# Patient Record
Sex: Female | Born: 1955
Health system: Southern US, Community
[De-identification: ages and names within clinical notes are randomized; demographics above are authoritative.]

## PROBLEM LIST (undated history)

## (undated) DIAGNOSIS — IMO0002 Reserved for concepts with insufficient information to code with codable children: Secondary | ICD-10-CM

## (undated) DIAGNOSIS — L570 Actinic keratosis: Secondary | ICD-10-CM

## (undated) DIAGNOSIS — G709 Myoneural disorder, unspecified: Secondary | ICD-10-CM

## (undated) HISTORY — PX: BREAST EXCISIONAL BIOPSY: SUR124

## (undated) HISTORY — DX: Myoneural disorder, unspecified: G70.9

## (undated) HISTORY — DX: Actinic keratosis: L57.0

## (undated) HISTORY — DX: Reserved for concepts with insufficient information to code with codable children: IMO0002

---
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
## 2004-03-07 ENCOUNTER — Ambulatory Visit: Payer: Self-pay | Admitting: Unknown Physician Specialty

## 2005-01-29 LAB — HM COLONOSCOPY

## 2005-03-01 ENCOUNTER — Ambulatory Visit: Payer: Self-pay | Admitting: Unknown Physician Specialty

## 2005-05-16 ENCOUNTER — Ambulatory Visit: Payer: Self-pay | Admitting: Unknown Physician Specialty

## 2006-09-19 ENCOUNTER — Ambulatory Visit: Payer: Self-pay | Admitting: Unknown Physician Specialty

## 2007-04-24 DIAGNOSIS — IMO0002 Reserved for concepts with insufficient information to code with codable children: Secondary | ICD-10-CM

## 2007-04-24 HISTORY — DX: Reserved for concepts with insufficient information to code with codable children: IMO0002

## 2007-10-16 ENCOUNTER — Ambulatory Visit: Payer: Self-pay | Admitting: Unknown Physician Specialty

## 2007-10-21 ENCOUNTER — Ambulatory Visit: Payer: Self-pay | Admitting: Unknown Physician Specialty

## 2007-11-20 ENCOUNTER — Ambulatory Visit: Payer: Self-pay | Admitting: Specialist

## 2008-11-05 ENCOUNTER — Ambulatory Visit: Payer: Self-pay | Admitting: Unknown Physician Specialty

## 2009-11-23 ENCOUNTER — Ambulatory Visit: Payer: Self-pay | Admitting: Unknown Physician Specialty

## 2010-04-23 HISTORY — PX: BREAST BIOPSY: SHX20

## 2010-06-27 ENCOUNTER — Ambulatory Visit: Payer: Self-pay | Admitting: General Surgery

## 2010-11-28 ENCOUNTER — Ambulatory Visit: Payer: Self-pay | Admitting: General Surgery

## 2010-12-14 LAB — HM PAP SMEAR: HM Pap smear: NORMAL

## 2011-07-27 ENCOUNTER — Ambulatory Visit: Payer: Self-pay | Admitting: Internal Medicine

## 2011-09-13 ENCOUNTER — Ambulatory Visit (INDEPENDENT_AMBULATORY_CARE_PROVIDER_SITE_OTHER): Payer: BC Managed Care – PPO | Admitting: Internal Medicine

## 2011-09-13 ENCOUNTER — Encounter: Payer: Self-pay | Admitting: Internal Medicine

## 2011-09-13 VITALS — BP 98/60 | HR 66 | Temp 98.1°F | Resp 14 | Ht 64.5 in | Wt 109.5 lb

## 2011-09-13 DIAGNOSIS — IMO0002 Reserved for concepts with insufficient information to code with codable children: Secondary | ICD-10-CM

## 2011-09-13 MED ORDER — CYCLOBENZAPRINE HCL 10 MG PO TABS: 10.0000 mg | ORAL_TABLET | Freq: Three times a day (TID) | ORAL | Status: AC | PRN

## 2011-09-13 MED ORDER — MELOXICAM 15 MG PO TABS: 15.0000 mg | ORAL_TABLET | Freq: Every day | ORAL | Status: AC

## 2011-09-13 NOTE — Progress Notes (Addendum)
Patient ID: Katrina Fuentes, female   DOB: 08-04-1955, 56 y.o.   MRN: [id number]  There is no problem list on file for this patient.   Subjective:  CC:   Chief Complaint  Patient presents with  . New Patient    HPI:   Katrina Fuentes a 56 y.o. female who presents To establish primary care as a new patient.  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  She occasional episodes of insomnia. She has a chronic  Neck problem.  History of acute torticollis a few years ago ,  Nontraumatic.  Saw Reita Chard, evaluation revealed cervical disk diease by MRI.  Pain and spasm improved by mobic and PTbut  in  Dec 2012, but her left side started bothering her.  She wears a cervical collar at night but has not tried a cervical pillow.  She is going hiking through [place] June 20th.     History reviewed. No pertinent past medical history.  Past Surgical History  Procedure Date  . Breast biopsy 2012    by Evette Cristal         The following portions of the patient's history were reviewed and updated as appropriate: Allergies, current medications, and problem list.    Review of Systems:   12 Pt  review of systems was negative except those addressed in the HPI,     History   Social History  . Marital Status: Married    Spouse Name: N/A    Number of Children: N/A  . Years of Education: N/A   Occupational History  . Not on file.   Social History Main Topics  . Smoking status: Never Smoker   . Smokeless tobacco: Never Used  . Alcohol Use: 4.2 oz/week    7 Glasses of wine per week     wine  . Drug Use: No  . Sexually Active: Not on file   Other Topics Concern  . Not on file   Social History Narrative  . No narrative on file    Objective:  BP 98/60  Pulse 66  Temp(Src) 98.1 F (36.7 C) (Oral)  Resp 14  Ht 5' 4.5" (1.638 m)  Wt 109 lb 8 oz (49.669 kg)  BMI 18.51 kg/m2  SpO2 98%  General appearance: alert, cooperative and  appears stated age Ears: normal TM's and external ear canals both ears Throat: lips, mucosa, and tongue normal; teeth and gums normal Neck: no adenopathy, no carotid bruit, supple, symmetrical, trachea midline and thyroid not enlarged, symmetric, no tenderness/mass/nodules Back: symmetric, no curvature. ROM normal. No CVA tenderness. Lungs: clear to auscultation bilaterally Heart: regular rate and rhythm, S1, S2 normal, no murmur, click, rub or gallop Abdomen: soft, non-tender; bowel sounds normal; no masses,  no organomegaly Pulses: 2+ and symmetric Skin: Skin color, texture, turgor normal. No rashes or lesions Lymph nodes: Cervical, supraclavicular, and axillary nodes normal.   Degenerative disk disease She mild disk herniation at C4-C5 and C5-C6 on the left with persistent low grade pain involving the ear and side of head.  Given upcoming travel, risk of aggravation is increased with luggage carrying.     Updated Medication List Outpatient Encounter Prescriptions as of 09/13/2011  Medication Sig Dispense Refill  . cholecalciferol (VITAMIN D) 1000 UNITS tablet Take 1,000 Units by mouth daily.      . cyclobenzaprine (FLEXERIL) 10 MG tablet Take 1 tablet (10 mg total) by mouth 3 (three) times daily as needed for muscle spasms.  30 tablet  0  . meloxicam (MOBIC) 15 MG tablet Take 1 tablet (15 mg total) by mouth daily.  30 tablet  3  . Naproxen Sodium (ALEVE PO) Take by mouth as needed.       Assessment and Plan:

## 2011-09-17 ENCOUNTER — Encounter: Payer: Self-pay | Admitting: Internal Medicine

## 2011-09-17 DIAGNOSIS — IMO0002 Reserved for concepts with insufficient information to code with codable children: Secondary | ICD-10-CM | POA: Insufficient documentation

## 2011-09-17 NOTE — Assessment & Plan Note (Signed)
She mild disk herniation at C4-C5 and C5-C6 on the left with persistent low grade pain involving the ear and side of head.  Given upcoming travel, risk of aggravation is increased with luggage carrying.

## 2011-11-30 LAB — HM MAMMOGRAPHY: HM Mammogram: NORMAL

## 2011-12-04 ENCOUNTER — Ambulatory Visit: Payer: Self-pay | Admitting: General Surgery

## 2012-01-19 LAB — HM PAP SMEAR: HM Pap smear: ABNORMAL

## 2012-01-30 ENCOUNTER — Encounter: Payer: Self-pay | Admitting: Internal Medicine

## 2012-01-30 ENCOUNTER — Other Ambulatory Visit (HOSPITAL_COMMUNITY)
Admission: RE | Admit: 2012-01-30 | Discharge: 2012-01-30 | Disposition: A | Discharge status: Home / Self Care | Payer: BC Managed Care – PPO | Source: Ambulatory Visit | Attending: Internal Medicine | Admitting: Internal Medicine

## 2012-01-30 ENCOUNTER — Ambulatory Visit (INDEPENDENT_AMBULATORY_CARE_PROVIDER_SITE_OTHER): Payer: BC Managed Care – PPO | Admitting: Internal Medicine

## 2012-01-30 VITALS — BP 102/60 | HR 55 | Temp 98.2°F | Ht 64.5 in | Wt 107.8 lb

## 2012-01-30 DIAGNOSIS — N950 Postmenopausal bleeding: Secondary | ICD-10-CM

## 2012-01-30 DIAGNOSIS — Z1211 Encounter for screening for malignant neoplasm of colon: Secondary | ICD-10-CM

## 2012-01-30 DIAGNOSIS — N889 Noninflammatory disorder of cervix uteri, unspecified: Secondary | ICD-10-CM

## 2012-01-30 DIAGNOSIS — Z23 Encounter for immunization: Secondary | ICD-10-CM

## 2012-01-30 DIAGNOSIS — Z01419 Encounter for gynecological examination (general) (routine) without abnormal findings: Secondary | ICD-10-CM | POA: Insufficient documentation

## 2012-01-30 NOTE — Assessment & Plan Note (Signed)
PAP was done but cervix was abnormal appearing,  Friable and pale with a polypoid appearance.  Given her history of post menopausal bleeding, I have recommended gynecologic referral for endometria biopsy.

## 2012-01-30 NOTE — Progress Notes (Signed)
Patient ID: Katrina Fuentes, female   DOB: [birth year], 56 y.o.   MRN: [id number]  Subjective:     Katrina Fuentes is a 56 y.o. female and is here for a comprehensive physical exam. The patient reports an episode of postmenopausal bleeding several months ago. Amount of bleeding was light ,  Lasted about 24 hours ago.  Not sure if it occurred after intercourse. Last menstrual period  was 4 years ago.   History   Social History  . Marital Status: Married    Spouse Name: N/A    Number of Children: N/A  . Years of Education: N/A   Occupational History  . Not on file.   Social History Main Topics  . Smoking status: Never Smoker   . Smokeless tobacco: Never Used  . Alcohol Use: 4.2 oz/week    7 Glasses of wine per week     wine  . Drug Use: No  . Sexually Active: Not on file   Other Topics Concern  . Not on file   Social History Narrative  . No narrative on file   Health Maintenance  Topic Date Due  . Tetanus/tdap  07/25/1976  . Mammogram  07/26/2007  . Influenza Vaccine  12/23/2011  . Pap Smear  12/13/2013  . Colonoscopy  03/02/2015    The following portions of the patient's history were reviewed and updated as appropriate: allergies, current medications, past family history, past medical history, past social history, past surgical history and problem list.  Review of Systems A comprehensive review of systems was negative.   Objective:    BP 102/60  Pulse 55  Temp 98.2 F (36.8 C) (Oral)  Ht 5' 4.5" (1.638 m)  Wt 107 lb 12 oz (48.875 kg)  BMI 18.21 kg/m2  SpO2 99% General appearance: alert, cooperative and appears stated age Ears: normal TM's and external ear canals both ears Neck: no adenopathy, no carotid bruit, no JVD, supple, symmetrical, trachea midline and thyroid not enlarged, symmetric, no tenderness/mass/nodules Back: negative, symmetric, no curvature. ROM normal. No CVA tenderness. Lungs: clear to auscultation bilaterally Breasts: normal appearance, no masses or  tenderness Heart: regular rate and rhythm, S1, S2 normal, no murmur, click, rub or gallop Abdomen: soft, non-tender; bowel sounds normal; no masses,  no organomegaly Pelvic: adnexae not palpable, external genitalia normal, no adnexal masses or tenderness, no cervical motion tenderness, rectovaginal septum normal, uterus normal size, shape, and consistency and vagina normal without discharge Extremities: extremities normal, atraumatic, no cyanosis or edema Pulses: 2+ and symmetric Lymph nodes: Cervical, supraclavicular, and axillary nodes normal.    Assessment:     Abnormal appearance of cervix PAP was done but cervix was abnormal appearing,  Friable and pale with a polypoid appearance.  Given her history of post menopausal bleeding, I have recommended gynecologic referral for endometria biopsy.   Updated Medication List Outpatient Encounter Prescriptions as of 01/30/2012  Medication Sig Dispense Refill  . cholecalciferol (VITAMIN D) 1000 UNITS tablet Take 1,000 Units by mouth daily.      . meloxicam (MOBIC) 15 MG tablet Take 1 tablet (15 mg total) by mouth daily.  30 tablet  3  . Naproxen Sodium (ALEVE PO) Take by mouth as needed.

## 2012-06-16 ENCOUNTER — Ambulatory Visit: Payer: Self-pay | Admitting: Unknown Physician Specialty

## 2012-07-14 ENCOUNTER — Encounter: Payer: Self-pay | Admitting: Internal Medicine

## 2012-07-18 LAB — HM COLONOSCOPY

## 2013-01-22 ENCOUNTER — Ambulatory Visit: Payer: Self-pay | Admitting: Internal Medicine

## 2014-01-18 ENCOUNTER — Ambulatory Visit (INDEPENDENT_AMBULATORY_CARE_PROVIDER_SITE_OTHER): Payer: PRIVATE HEALTH INSURANCE | Admitting: Internal Medicine

## 2014-01-18 ENCOUNTER — Encounter: Payer: Self-pay | Admitting: Internal Medicine

## 2014-01-18 ENCOUNTER — Other Ambulatory Visit: Payer: Self-pay | Admitting: Internal Medicine

## 2014-01-18 VITALS — BP 104/70 | HR 51 | Temp 98.1°F | Resp 16 | Ht 64.5 in | Wt 106.5 lb

## 2014-01-18 DIAGNOSIS — Z0000 Encounter for general adult medical examination without abnormal findings: Secondary | ICD-10-CM

## 2014-01-18 DIAGNOSIS — E559 Vitamin D deficiency, unspecified: Secondary | ICD-10-CM

## 2014-01-18 DIAGNOSIS — M25441 Effusion, right hand: Secondary | ICD-10-CM

## 2014-01-18 DIAGNOSIS — R5381 Other malaise: Secondary | ICD-10-CM

## 2014-01-18 DIAGNOSIS — Z1239 Encounter for other screening for malignant neoplasm of breast: Secondary | ICD-10-CM

## 2014-01-18 DIAGNOSIS — R5383 Other fatigue: Secondary | ICD-10-CM

## 2014-01-18 DIAGNOSIS — N889 Noninflammatory disorder of cervix uteri, unspecified: Secondary | ICD-10-CM

## 2014-01-18 DIAGNOSIS — M25449 Effusion, unspecified hand: Secondary | ICD-10-CM

## 2014-01-18 DIAGNOSIS — Z1159 Encounter for screening for other viral diseases: Secondary | ICD-10-CM

## 2014-01-18 LAB — COMPREHENSIVE METABOLIC PANEL
ALT: 22 U/L (ref 0–35)
AST: 28 U/L (ref 0–37)
Albumin: 4.2 g/dL (ref 3.5–5.2)
Alkaline Phosphatase: 73 U/L (ref 39–117)
BILIRUBIN TOTAL: 0.7 mg/dL (ref 0.2–1.2)
BUN: 19 mg/dL (ref 6–23)
CO2: 31 mEq/L (ref 19–32)
CREATININE: 0.6 mg/dL (ref 0.4–1.2)
Calcium: 9.3 mg/dL (ref 8.4–10.5)
Chloride: 102 mEq/L (ref 96–112)
GFR: 101.85 mL/min (ref >60.00)
Glucose, Bld: 86 mg/dL (ref 70–99)
Potassium: 4.3 mEq/L (ref 3.5–5.1)
Sodium: 137 mEq/L (ref 135–145)
Total Protein: 6.3 g/dL (ref 6.0–8.3)

## 2014-01-18 LAB — CBC WITH DIFFERENTIAL/PLATELET
BASOS ABS: 0 10*3/uL (ref 0.0–0.1)
Basophils Relative: 0.7 % (ref 0.0–3.0)
Eosinophils Absolute: 0.2 10*3/uL (ref 0.0–0.7)
Eosinophils Relative: 3.7 % (ref 0.0–5.0)
HCT: 40.6 % (ref 36.0–46.0)
HEMOGLOBIN: 13.5 g/dL (ref 12.0–15.0)
Lymphocytes Relative: 31.4 % (ref 12.0–46.0)
Lymphs Abs: 1.5 10*3/uL (ref 0.7–4.0)
MCHC: 33.3 g/dL (ref 30.0–36.0)
MCV: 92.6 fl (ref 78.0–100.0)
Monocytes Absolute: 0.5 10*3/uL (ref 0.1–1.0)
Monocytes Relative: 9.8 % (ref 3.0–12.0)
NEUTROS ABS: 2.6 10*3/uL (ref 1.4–7.7)
Neutrophils Relative %: 54.4 % (ref 43.0–77.0)
Platelets: 178 10*3/uL (ref 150.0–400.0)
RBC: 4.38 Mil/uL (ref 3.87–5.11)
RDW: 14 % (ref 11.5–15.5)
WBC: 4.8 10*3/uL (ref 4.0–10.5)

## 2014-01-18 LAB — VITAMIN D 25 HYDROXY (VIT D DEFICIENCY, FRACTURES): VITD: 52.76 ng/mL (ref 30.00–100.00)

## 2014-01-18 LAB — TSH: TSH: 2.59 u[IU]/mL (ref 0.35–4.50)

## 2014-01-18 LAB — C-REACTIVE PROTEIN: CRP: <0.5 mg/dL (ref 0.5–20.0)

## 2014-01-18 LAB — SEDIMENTATION RATE: SED RATE: 9 mm/h (ref 0–22)

## 2014-01-18 MED ORDER — MELOXICAM 15 MG PO TABS: 15.0000 mg | ORAL_TABLET | Freq: Every day | ORAL | Status: DC

## 2014-01-18 MED ORDER — DICLOFENAC SODIUM 1 % TD GEL
2.0000 g | Freq: Four times a day (QID) | TRANSDERMAL | Status: DC
Start: 2014-01-18 — End: 2016-06-06

## 2014-01-18 NOTE — Assessment & Plan Note (Signed)
Knuckle is slightly warm and twice normal size.  ERS, CRP and x ray ordered to rule out erosive arthritis.

## 2014-01-18 NOTE — Assessment & Plan Note (Signed)
Endometrial and cervical biopsy were done in 2013 after normal PAP and were normal.  Changes were attributed to scarring .  She has no history fo abnormal PAP smears/

## 2014-01-18 NOTE — Assessment & Plan Note (Signed)
Annual comprehensive exam was done including breast, excluding pelvic and PAP smear. All screenings have been addressed and a printed health maintenance schedule was given to patient.   

## 2014-01-18 NOTE — Progress Notes (Signed)
Patient ID: Katrina Fuentes, female   DOB: 09/20/1955, 58 y.o.   MRN: [id number]  Subjective:     Katrina Fuentes is a 58 y.o. female and is here for a comprehensive physical exam. The patient reports problems - with persistent swelling and deformity of middle finger on right hand PIP.  History   Social History  . Marital Status: Married    Spouse Name: N/A    Number of Children: N/A  . Years of Education: N/A   Occupational History  . Not on file.   Social History Main Topics  . Smoking status: Never Smoker   . Smokeless tobacco: Never Used  . Alcohol Use: 4.2 oz/week    7 Glasses of wine per week     Comment: wine  . Drug Use: No  . Sexual Activity: Not on file   Other Topics Concern  . Not on file   Social History Narrative  . No narrative on file   Health Maintenance  Topic Date Due  . Tetanus/tdap  07/25/1976  . Influenza Vaccine  11/21/2013  . Mammogram  11/29/2013  . Pap Smear  02/24/2015  . Colonoscopy  06/16/2022    The following portions of the patient's history were reviewed and updated as appropriate: allergies, current medications, past family history, past medical history, past social history, past surgical history and problem list.  Review of Systems A comprehensive review of systems was negative.   Objective:    BP 104/70  Pulse 51  Temp(Src) 98.1 F (36.7 C) (Oral)  Resp 16  Ht 5' 4.5" (1.638 m)  Wt 106 lb 8 oz (48.308 kg)  BMI 18.00 kg/m2  SpO2 99%  General appearance: alert, cooperative and appears stated age Ears: normal TM's and external ear canals both ears Throat: lips, mucosa, and tongue normal; teeth and gums normal Neck: no adenopathy, no carotid bruit, supple, symmetrical, trachea midline and thyroid not enlarged, symmetric, no tenderness/mass/nodules Back: symmetric, no curvature. ROM normal. No CVA tenderness. Lungs: clear to auscultation bilaterally Heart: regular rate and rhythm, S1, S2 normal, no murmur, click, rub or  gallop Abdomen: soft, non-tender; bowel sounds normal; no masses,  no organomegaly Pulses: 2+ and symmetric Skin: Skin color, texture, turgor normal. No rashes or lesions Lymph nodes: Cervical, supraclavicular, and axillary nodes normal.   Assessment and plan:   Abnormal appearance of cervix Endometrial and cervical biopsy were done in 2013 after normal PAP and were normal.  Changes were attributed to scarring .  She has no history fo abnormal PAP smears/  Swelling of finger joint of right hand Knuckle is slightly warm and twice normal size.  ERS, CRP and x ray ordered to rule out erosive arthritis.   Visit for preventive health examination Annual comprehensive exam was done including breast, excluding pelvic and PAP smear. All screenings have been addressed and a printed health maintenance schedule was given to patient.     Updated Medication List Outpatient Encounter Prescriptions as of 01/18/2014  Medication Sig  . cholecalciferol (VITAMIN D) 1000 UNITS tablet Take 1,000 Units by mouth daily.  . diclofenac sodium (VOLTAREN) 1 % GEL Apply 2 g topically 4 (four) times daily.  . diclofenac sodium (VOLTAREN) 1 % GEL Apply 2 g topically 4 (four) times daily.  . meloxicam (MOBIC) 15 MG tablet Take 1 tablet (15 mg total) by mouth daily.  . Naproxen Sodium (ALEVE PO) Take by mouth as needed.  . [DISCONTINUED] meloxicam (MOBIC) 15 MG tablet Take 15 mg by  mouth daily.

## 2014-01-18 NOTE — Progress Notes (Signed)
Pre-visit discussion using our clinic review tool. No additional management support is needed unless otherwise documented below in the visit note.  

## 2014-01-18 NOTE — Patient Instructions (Signed)
You had your annual  wellness exam today.  We will repeat your PAP smear in 2016, sooner if needed   We will schedule your mammogram at [facility] received the TDaP vaccine today.  If you develop a sore arm , use tylenol and ibuprofen for a few days   We will contact you with the bloodwork results  Health Maintenance Adopting a healthy lifestyle and getting preventive care can go a long way to promote health and wellness. Talk with your health care provider about what schedule of regular examinations is right for you. This is a good chance for you to check in with your provider about disease prevention and staying healthy. In between checkups, there are plenty of things you can do on your own. Experts have done a lot of research about which lifestyle changes and preventive measures are most likely to keep you healthy. Ask your health care provider for more information. WEIGHT AND DIET  Eat a healthy diet  Be sure to include plenty of vegetables, fruits, low-fat dairy products, and lean protein.  Do not eat a lot of foods high in solid fats, added sugars, or salt.  Get regular exercise. This is one of the most important things you can do for your health.  Most adults should exercise for at least 150 minutes each week. The exercise should increase your heart rate and make you sweat (moderate-intensity exercise).  Most adults should also do strengthening exercises at least twice a week. This is in addition to the moderate-intensity exercise.  Maintain a healthy weight  Body mass index (BMI) is a measurement that can be used to identify possible weight problems. It estimates body fat based on height and weight. Your health care provider can help determine your BMI and help you achieve or maintain a healthy weight.  For females 73 years of age and older:   A BMI below 18.5 is considered underweight.  A BMI of 18.5 to 24.9 is normal.  A BMI of 25 to 29.9 is considered overweight.  A  BMI of 30 and above is considered obese.  Watch levels of cholesterol and blood lipids  You should start having your blood tested for lipids and cholesterol at 56 years of age, then have this test every 5 years.  You may need to have your cholesterol levels checked more often if:  Your lipid or cholesterol levels are high.  You are older than 56 years of age.  You are at high risk for heart disease.  CANCER SCREENING   Lung Cancer  Lung cancer screening is recommended for adults 50-59 years old who are at high risk for lung cancer because of a history of smoking.  A yearly low-dose CT scan of the lungs is recommended for people who:  Currently smoke.  Have quit within the past 15 years.  Have at least a 30-pack-year history of smoking. A pack year is smoking an average of one pack of cigarettes a day for 1 year.  Yearly screening should continue until it has been 15 years since you quit.  Yearly screening should stop if you develop a health problem that would prevent you from having lung cancer treatment.  Breast Cancer  Practice breast self-awareness. This means understanding how your breasts normally appear and feel.  It also means doing regular breast self-exams. Let your health care provider know about any changes, no matter how small.  If you are in your 20s or 30s, you should have a  clinical breast exam (CBE) by a health care provider every 1-3 years as part of a regular health exam.  If you are 41 or older, have a CBE every year. Also consider having a breast X-ray (mammogram) every year.  If you have a family history of breast cancer, talk to your health care provider about genetic screening.  If you are at high risk for breast cancer, talk to your health care provider about having an MRI and a mammogram every year.  Breast cancer gene (BRCA) assessment is recommended for women who have family members with BRCA-related cancers. BRCA-related cancers  include:  Breast.  Ovarian.  Tubal.  Peritoneal cancers.  Results of the assessment will determine the need for genetic counseling and BRCA1 and BRCA2 testing. Cervical Cancer Routine pelvic examinations to screen for cervical cancer are no longer recommended for nonpregnant women who are considered low risk for cancer of the pelvic organs (ovaries, uterus, and vagina) and who do not have symptoms. A pelvic examination may be necessary if you have symptoms including those associated with pelvic infections. Ask your health care provider if a screening pelvic exam is right for you.   The Pap test is the screening test for cervical cancer for women who are considered at risk.  If you had a hysterectomy for a problem that was not cancer or a condition that could lead to cancer, then you no longer need Pap tests.  If you are older than 65 years, and you have had normal Pap tests for the past 10 years, you no longer need to have Pap tests.  If you have had past treatment for cervical cancer or a condition that could lead to cancer, you need Pap tests and screening for cancer for at least 20 years after your treatment.  If you no longer get a Pap test, assess your risk factors if they change (such as having a new sexual partner). This can affect whether you should start being screened again.  Some women have medical problems that increase their chance of getting cervical cancer. If this is the case for you, your health care provider may recommend more frequent screening and Pap tests.  The human papillomavirus (HPV) test is another test that may be used for cervical cancer screening. The HPV test looks for the virus that can cause cell changes in the cervix. The cells collected during the Pap test can be tested for HPV.  The HPV test can be used to screen women 60 years of age and older. Getting tested for HPV can extend the interval between normal Pap tests from three to five years.  An HPV  test also should be used to screen women of any age who have unclear Pap test results.  After 56 years of age, women should have HPV testing as often as Pap tests.  Colorectal Cancer  This type of cancer can be detected and often prevented.  Routine colorectal cancer screening usually begins at 56 years of age and continues through 56 years of age.  Your health care provider may recommend screening at an earlier age if you have risk factors for colon cancer.  Your health care provider may also recommend using home test kits to check for hidden blood in the stool.  A small camera at the end of a tube can be used to examine your colon directly (sigmoidoscopy or colonoscopy). This is done to check for the earliest forms of colorectal cancer.  Routine screening usually begins at  age 33.  Direct examination of the colon should be repeated every 5-10 years through 56 years of age. However, you may need to be screened more often if early forms of precancerous polyps or small growths are found. Skin Cancer  Check your skin from head to toe regularly.  Tell your health care provider about any new moles or changes in moles, especially if there is a change in a mole's shape or color.  Also tell your health care provider if you have a mole that is larger than the size of a pencil eraser.  Always use sunscreen. Apply sunscreen liberally and repeatedly throughout the day.  Protect yourself by wearing long sleeves, pants, a wide-brimmed hat, and sunglasses whenever you are outside. HEART DISEASE, DIABETES, AND HIGH BLOOD PRESSURE   Have your blood pressure checked at least every 1-2 years. High blood pressure causes heart disease and increases the risk of stroke.  If you are between 36 years and 7 years old, ask your health care provider if you should take aspirin to prevent strokes.  Have regular diabetes screenings. This involves taking a blood sample to check your fasting blood sugar  level.  If you are at a normal weight and have a low risk for diabetes, have this test once every three years after 56 years of age.  If you are overweight and have a high risk for diabetes, consider being tested at a younger age or more often. PREVENTING INFECTION  Hepatitis B  If you have a higher risk for hepatitis B, you should be screened for this virus. You are considered at high risk for hepatitis B if:  You were born in a country where hepatitis B is common. Ask your health care provider which countries are considered high risk.  Your parents were born in a high-risk country, and you have not been immunized against hepatitis B (hepatitis B vaccine).  You have HIV or AIDS.  You use needles to inject street drugs.  You live with someone who has hepatitis B.  You have had sex with someone who has hepatitis B.  You get hemodialysis treatment.  You take certain medicines for conditions, including cancer, organ transplantation, and autoimmune conditions. Hepatitis C  Blood testing is recommended for:  Everyone born from [birth year] through 1963.  Anyone with known risk factors for hepatitis C. Sexually transmitted infections (STIs)  You should be screened for sexually transmitted infections (STIs) including gonorrhea and chlamydia if:  You are sexually active and are younger than 56 years of age.  You are older than 56 years of age and your health care provider tells you that you are at risk for this type of infection.  Your sexual activity has changed since you were last screened and you are at an increased risk for chlamydia or gonorrhea. Ask your health care provider if you are at risk.  If you do not have HIV, but are at risk, it may be recommended that you take a prescription medicine daily to prevent HIV infection. This is called pre-exposure prophylaxis (PrEP). You are considered at risk if:  You are sexually active and do not regularly use condoms or know the HIV status  of your partner(s).  You take drugs by injection.  You are sexually active with a partner who has HIV. Talk with your health care provider about whether you are at high risk of being infected with HIV. If you choose to begin PrEP, you should first be tested for HIV. You  should then be tested every 3 months for as long as you are taking PrEP.  PREGNANCY   If you are premenopausal and you may become pregnant, ask your health care provider about preconception counseling.  If you may become pregnant, take 400 to 800 micrograms (mcg) of folic acid every day.  If you want to prevent pregnancy, talk to your health care provider about birth control (contraception). OSTEOPOROSIS AND MENOPAUSE   Osteoporosis is a disease in which the bones lose minerals and strength with aging. This can result in serious bone fractures. Your risk for osteoporosis can be identified using a bone density scan.  If you are 50 years of age or older, or if you are at risk for osteoporosis and fractures, ask your health care provider if you should be screened.  Ask your health care provider whether you should take a calcium or vitamin D supplement to lower your risk for osteoporosis.  Menopause may have certain physical symptoms and risks.  Hormone replacement therapy may reduce some of these symptoms and risks. Talk to your health care provider about whether hormone replacement therapy is right for you.  HOME CARE INSTRUCTIONS   Schedule regular health, dental, and eye exams.  Stay current with your immunizations.   Do not use any tobacco products including cigarettes, chewing tobacco, or electronic cigarettes.  If you are pregnant, do not drink alcohol.  If you are breastfeeding, limit how much and how often you drink alcohol.  Limit alcohol intake to no more than 1 drink per day for nonpregnant women. One drink equals 12 ounces of beer, 5 ounces of wine, or 1 ounces of hard liquor.  Do not use street  drugs.  Do not share needles.  Ask your health care provider for help if you need support or information about quitting drugs.  Tell your health care provider if you often feel depressed.  Tell your health care provider if you have ever been abused or do not feel safe at home. Document Released: 10/23/2010 Document Revised: 08/24/2013 Document Reviewed: 03/11/2013 [facility] Patient Information 2015 [place], [place]. This information is not intended to replace advice given to you by your health care provider. Make sure you discuss any questions you have with your health care provider.

## 2014-01-19 LAB — HEPATITIS C ANTIBODY: HCV Ab: NEGATIVE

## 2014-01-20 ENCOUNTER — Encounter: Payer: Self-pay | Admitting: *Deleted

## 2014-02-03 ENCOUNTER — Ambulatory Visit: Payer: Self-pay | Admitting: Internal Medicine

## 2014-02-05 ENCOUNTER — Encounter: Payer: Self-pay | Admitting: *Deleted

## 2014-02-16 ENCOUNTER — Encounter: Payer: Self-pay | Admitting: Internal Medicine

## 2015-05-02 ENCOUNTER — Other Ambulatory Visit: Payer: Self-pay | Admitting: Internal Medicine

## 2015-06-03 ENCOUNTER — Other Ambulatory Visit (HOSPITAL_COMMUNITY)
Admission: RE | Admit: 2015-06-03 | Discharge: 2015-06-03 | Disposition: A | Discharge status: Home / Self Care | Payer: Managed Care, Other (non HMO) | Source: Ambulatory Visit | Attending: Internal Medicine | Admitting: Internal Medicine

## 2015-06-03 ENCOUNTER — Ambulatory Visit (INDEPENDENT_AMBULATORY_CARE_PROVIDER_SITE_OTHER): Payer: Managed Care, Other (non HMO) | Admitting: Internal Medicine

## 2015-06-03 ENCOUNTER — Encounter: Payer: Self-pay | Admitting: Internal Medicine

## 2015-06-03 VITALS — BP 100/78 | HR 63 | Temp 98.1°F | Resp 12 | Ht 65.0 in | Wt 105.8 lb

## 2015-06-03 DIAGNOSIS — R5383 Other fatigue: Secondary | ICD-10-CM | POA: Diagnosis not present

## 2015-06-03 DIAGNOSIS — M720 Palmar fascial fibromatosis [Dupuytren]: Secondary | ICD-10-CM

## 2015-06-03 DIAGNOSIS — Z1151 Encounter for screening for human papillomavirus (HPV): Secondary | ICD-10-CM | POA: Insufficient documentation

## 2015-06-03 DIAGNOSIS — Z124 Encounter for screening for malignant neoplasm of cervix: Secondary | ICD-10-CM

## 2015-06-03 DIAGNOSIS — E559 Vitamin D deficiency, unspecified: Secondary | ICD-10-CM

## 2015-06-03 DIAGNOSIS — E785 Hyperlipidemia, unspecified: Secondary | ICD-10-CM

## 2015-06-03 DIAGNOSIS — Z01419 Encounter for gynecological examination (general) (routine) without abnormal findings: Secondary | ICD-10-CM | POA: Insufficient documentation

## 2015-06-03 DIAGNOSIS — Z0000 Encounter for general adult medical examination without abnormal findings: Secondary | ICD-10-CM

## 2015-06-03 DIAGNOSIS — N952 Postmenopausal atrophic vaginitis: Secondary | ICD-10-CM

## 2015-06-03 DIAGNOSIS — M8580 Other specified disorders of bone density and structure, unspecified site: Secondary | ICD-10-CM

## 2015-06-03 DIAGNOSIS — Z1239 Encounter for other screening for malignant neoplasm of breast: Secondary | ICD-10-CM

## 2015-06-03 LAB — LIPID PANEL
CHOL/HDL RATIO: 2
Cholesterol: 256 mg/dL — ABNORMAL HIGH (ref 0–200)
HDL: 109.6 mg/dL (ref >39.00)
LDL Cholesterol: 135 mg/dL — ABNORMAL HIGH (ref 0–99)
NONHDL: 145.95
Triglycerides: 53 mg/dL (ref 0.0–149.0)
VLDL: 10.6 mg/dL (ref 0.0–40.0)

## 2015-06-03 LAB — TSH: TSH: 2.87 u[IU]/mL (ref 0.35–4.50)

## 2015-06-03 LAB — COMPREHENSIVE METABOLIC PANEL
ALBUMIN: 4.5 g/dL (ref 3.5–5.2)
ALK PHOS: 83 U/L (ref 39–117)
ALT: 16 U/L (ref 0–35)
AST: 24 U/L (ref 0–37)
BUN: 18 mg/dL (ref 6–23)
CHLORIDE: 104 meq/L (ref 96–112)
CO2: 32 mEq/L (ref 19–32)
Calcium: 10 mg/dL (ref 8.4–10.5)
Creatinine, Ser: 0.72 mg/dL (ref 0.40–1.20)
GFR: 88.47 mL/min (ref >60.00)
Glucose, Bld: 91 mg/dL (ref 70–99)
POTASSIUM: 4.3 meq/L (ref 3.5–5.1)
Sodium: 141 mEq/L (ref 135–145)
Total Bilirubin: 0.4 mg/dL (ref 0.2–1.2)
Total Protein: 7.1 g/dL (ref 6.0–8.3)

## 2015-06-03 LAB — VITAMIN D 25 HYDROXY (VIT D DEFICIENCY, FRACTURES): VITD: 45.84 ng/mL (ref 30.00–100.00)

## 2015-06-03 LAB — LDL CHOLESTEROL, DIRECT: Direct LDL: 116 mg/dL

## 2015-06-03 MED ORDER — ESTROGENS, CONJUGATED 0.625 MG/GM VA CREA: 1.0000 | TOPICAL_CREAM | Freq: Every day | VAGINAL | Status: DC

## 2015-06-03 NOTE — Progress Notes (Signed)
Pre-visit discussion using our clinic review tool. No additional management support is needed unless otherwise documented below in the visit note.  

## 2015-06-03 NOTE — Patient Instructions (Addendum)
We discussed starting premarin ( or estrace, whichever is covered by your insurance)   Use it nightly for 2 weeks,,  Then reduce to twice weekly   Menopause is a normal process in which your reproductive ability comes to an end. This process happens gradually over a span of months to years, usually between the ages of 43 and 50. Menopause is complete when you have missed 12 consecutive menstrual periods. It is important to talk with your health care provider about some of the most common conditions that affect postmenopausal women, such as heart disease, cancer, and bone loss (osteoporosis). Adopting a healthy lifestyle and getting preventive care can help to promote your health and wellness. Those actions can also lower your chances of developing some of these common conditions. WHAT SHOULD I KNOW ABOUT MENOPAUSE? During menopause, you may experience a number of symptoms, such as:  Moderate-to-severe hot flashes.  Night sweats.  Decrease in sex drive.  Mood swings.  Headaches.  Tiredness.  Irritability.  Memory problems.  Insomnia. Choosing to treat or not to treat menopausal changes is an individual decision that you make with your health care provider. WHAT SHOULD I KNOW ABOUT HORMONE REPLACEMENT THERAPY AND SUPPLEMENTS? Hormone therapy products are effective for treating symptoms that are associated with menopause, such as hot flashes and night sweats. Hormone replacement carries certain risks, especially as you become older. If you are thinking about using estrogen or estrogen with progestin treatments, discuss the benefits and risks with your health care provider. WHAT SHOULD I KNOW ABOUT HEART DISEASE AND STROKE? Heart disease, heart attack, and stroke become more likely as you age. This may be due, in part, to the hormonal changes that your body experiences during menopause. These can affect how your body processes dietary fats, triglycerides, and cholesterol. Heart attack and  stroke are both medical emergencies. There are many things that you can do to help prevent heart disease and stroke:  Have your blood pressure checked at least every 1-2 years. High blood pressure causes heart disease and increases the risk of stroke.  If you are 48-61 years old, ask your health care provider if you should take aspirin to prevent a heart attack or a stroke.  Do not use any tobacco products, including cigarettes, chewing tobacco, or electronic cigarettes. If you need help quitting, ask your health care provider.  It is important to eat a healthy diet and maintain a healthy weight.  Be sure to include plenty of vegetables, fruits, low-fat dairy products, and lean protein.  Avoid eating foods that are high in solid fats, added sugars, or salt (sodium).  Get regular exercise. This is one of the most important things that you can do for your health.  Try to exercise for at least 150 minutes each week. The type of exercise that you do should increase your heart rate and make you sweat. This is known as moderate-intensity exercise.  Try to do strengthening exercises at least twice each week. Do these in addition to the moderate-intensity exercise.  Know your numbers.Ask your health care provider to check your cholesterol and your blood glucose. Continue to have your blood tested as directed by your health care provider. WHAT SHOULD I KNOW ABOUT CANCER SCREENING? There are several types of cancer. Take the following steps to reduce your risk and to catch any cancer development as early as possible. Breast Cancer  Practice breast self-awareness.  This means understanding how your breasts normally appear and feel.  It also means  doing regular breast self-exams. Let your health care provider know about any changes, no matter how small.  If you are 77 or older, have a clinician do a breast exam (clinical breast exam or CBE) every year. Depending on your age, family history, and  medical history, it may be recommended that you also have a yearly breast X-ray (mammogram).  If you have a family history of breast cancer, talk with your health care provider about genetic screening.  If you are at high risk for breast cancer, talk with your health care provider about having an MRI and a mammogram every year.  Breast cancer (BRCA) gene test is recommended for women who have family members with BRCA-related cancers. Results of the assessment will determine the need for genetic counseling and BRCA1 and for BRCA2 testing. BRCA-related cancers include these types:  Breast. This occurs in males or females.  Ovarian.  Tubal. This may also be called fallopian tube cancer.  Cancer of the abdominal or pelvic lining (peritoneal cancer).  Prostate.  Pancreatic. Cervical, Uterine, and Ovarian Cancer Your health care provider may recommend that you be screened regularly for cancer of the pelvic organs. These include your ovaries, uterus, and vagina. This screening involves a pelvic exam, which includes checking for microscopic changes to the surface of your cervix (Pap test).  For women ages 21-65, health care providers may recommend a pelvic exam and a Pap test every three years. For women ages 30-65, they may recommend the Pap test and pelvic exam, combined with testing for human papilloma virus (HPV), every five years. Some types of HPV increase your risk of cervical cancer. Testing for HPV may also be done on women of any age who have unclear Pap test results.  Other health care providers may not recommend any screening for nonpregnant women who are considered low risk for pelvic cancer and have no symptoms. Ask your health care provider if a screening pelvic exam is right for you.  If you have had past treatment for cervical cancer or a condition that could lead to cancer, you need Pap tests and screening for cancer for at least 20 years after your treatment. If Pap tests have  been discontinued for you, your risk factors (such as having a new sexual partner) need to be reassessed to determine if you should start having screenings again. Some women have medical problems that increase the chance of getting cervical cancer. In these cases, your health care provider may recommend that you have screening and Pap tests more often.  If you have a family history of uterine cancer or ovarian cancer, talk with your health care provider about genetic screening.  If you have vaginal bleeding after reaching menopause, tell your health care provider.  There are currently no reliable tests available to screen for ovarian cancer. Lung Cancer Lung cancer screening is recommended for adults 44-25 years old who are at high risk for lung cancer because of a history of smoking. A yearly low-dose CT scan of the lungs is recommended if you:  Currently smoke.  Have a history of at least 30 pack-years of smoking and you currently smoke or have quit within the past 15 years. A pack-year is smoking an average of one pack of cigarettes per day for one year. Yearly screening should:  Continue until it has been 15 years since you quit.  Stop if you develop a health problem that would prevent you from having lung cancer treatment. Colorectal Cancer  This type  of cancer can be detected and can often be prevented.  Routine colorectal cancer screening usually begins at age 49 and continues through age 49.  If you have risk factors for colon cancer, your health care provider may recommend that you be screened at an earlier age.  If you have a family history of colorectal cancer, talk with your health care provider about genetic screening.  Your health care provider may also recommend using home test kits to check for hidden blood in your stool.  A small camera at the end of a tube can be used to examine your colon directly (sigmoidoscopy or colonoscopy). This is done to check for the earliest  forms of colorectal cancer.  Direct examination of the colon should be repeated every 5-10 years until age 65. However, if early forms of precancerous polyps or small growths are found or if you have a family history or genetic risk for colorectal cancer, you may need to be screened more often. Skin Cancer  Check your skin from head to toe regularly.  Monitor any moles. Be sure to tell your health care provider:  About any new moles or changes in moles, especially if there is a change in a mole's shape or color.  If you have a mole that is larger than the size of a pencil eraser.  If any of your family members has a history of skin cancer, especially at a young age, talk with your health care provider about genetic screening.  Always use sunscreen. Apply sunscreen liberally and repeatedly throughout the day.  Whenever you are outside, protect yourself by wearing long sleeves, pants, a wide-brimmed hat, and sunglasses. WHAT SHOULD I KNOW ABOUT OSTEOPOROSIS? Osteoporosis is a condition in which bone destruction happens more quickly than new bone creation. After menopause, you may be at an increased risk for osteoporosis. To help prevent osteoporosis or the bone fractures that can happen because of osteoporosis, the following is recommended:  If you are 75-76 years old, get at least 1,000 mg of calcium and at least 600 mg of vitamin D per day.  If you are older than age 66 but younger than age 43, get at least 1,200 mg of calcium and at least 600 mg of vitamin D per day.  If you are older than age 51, get at least 1,200 mg of calcium and at least 800 mg of vitamin D per day. Smoking and excessive alcohol intake increase the risk of osteoporosis. Eat foods that are rich in calcium and vitamin D, and do weight-bearing exercises several times each week as directed by your health care provider. WHAT SHOULD I KNOW ABOUT HOW MENOPAUSE AFFECTS [place]? Depression may occur at any age, but  it is more common as you become older. Common symptoms of depression include:  Low or sad mood.  Changes in sleep patterns.  Changes in appetite or eating patterns.  Feeling an overall lack of motivation or enjoyment of activities that you previously enjoyed.  Frequent crying spells. Talk with your health care provider if you think that you are experiencing depression. WHAT SHOULD I KNOW ABOUT IMMUNIZATIONS? It is important that you get and maintain your immunizations. These include:  Tetanus, diphtheria, and pertussis (Tdap) booster vaccine.  Influenza every year before the flu season begins.  Pneumonia vaccine.  Shingles vaccine. Your health care provider may also recommend other immunizations.   This information is not intended to replace advice given to you by your health care provider. Make  sure you discuss any questions you have with your health care provider.   Document Released: 06/01/2005 Document Revised: 04/30/2014 Document Reviewed: 12/10/2013 Elsevier Interactive Patient Education [employer].

## 2015-06-03 NOTE — Progress Notes (Signed)
Patient ID: Katrina Fuentes, female    DOB: Oct 07, 1955  Age: 60 y.o. MRN: [id number]  The patient is here for annual  wellness examination and management of other chronic and acute problems.   normal PAP 2013 Normal colonoscopy 2014,  rec repeat 2019 (FH polyps only)  Hep  C screen 2015 Last Mammogram 2015   Taking 1000 IUS Vit D  Daily    The risk factors are reflected in the social history.  The roster of all physicians providing medical care to patient - is listed in the Snapshot section of the chart.  Activities of daily living:  The patient is 100% independent in all ADLs: dressing, toileting, feeding as well as independent mobility  Home safety : The patient has smoke detectors in the home. They wear seatbelts.  There are no firearms at home. There is no violence in the home.   There is no risks for hepatitis, STDs or HIV. There is no   history of blood transfusion. They have no travel history to infectious disease endemic areas of the world.  The patient has seen their dentist in the last six month. They have seen their eye doctor in the last year. They admit to slight hearing difficulty with regard to whispered voices and some television programs.  They have deferred audiologic testing in the last year.  They do not  have excessive sun exposure. Discussed the need for sun protection: hats, long sleeves and use of sunscreen if there is significant sun exposure.   Diet: the importance of a healthy diet is discussed. They do have a healthy diet.  The benefits of regular aerobic exercise were discussed. She walks 4 times per week ,  20 minutes.   Depression screen: there are no signs or vegative symptoms of depression- irritability, change in appetite, anhedonia, sadness/tearfullness.  Cognitive assessment: the patient manages all their financial and personal affairs and is actively engaged. They could relate day,date,year and events; recalled 2/3 objects at 3 minutes; performed  clock-face test normally.  The following portions of the patient's history were reviewed and updated as appropriate: allergies, current medications, past family history, past medical history,  past surgical history, past social history  and problem list.  Visual acuity was not assessed per patient preference since she has regular follow up with her ophthalmologist. Hearing and body mass index were assessed and reviewed.   During the course of the visit the patient was educated and counseled about appropriate screening and preventive services including : fall prevention , diabetes screening, nutrition counseling, colorectal cancer screening, and recommended immunizations.    CC: The primary encounter diagnosis was Dupuytren's contracture of right hand. Diagnoses of Other fatigue, Hyperlipidemia, Vitamin D deficiency, Osteopenia, Breast cancer screening, Postmenopause atrophic vaginitis, Cervical cancer screening, Visit for preventive health examination, and Postmenopausal atrophic vaginitis were also pertinent to this visit.  History Katrina has a past medical history of Neuromuscular disorder ([place]) and Degenerative disk disease (2009).   She has past surgical history that includes Breast biopsy (2012).   Her family history includes Cancer (age of onset: 69) in her father; Hyperlipidemia in her father; Stroke in her father.She reports that she has never smoked. She has never used smokeless tobacco. She reports that she drinks about 4.2 oz of alcohol per week. She reports that she does not use illicit drugs.  Outpatient Prescriptions Prior to Visit  Medication Sig Dispense Refill  . cholecalciferol (VITAMIN D) 1000 UNITS tablet Take 1,000 Units by mouth daily.    Marland Kitchen  diclofenac sodium (VOLTAREN) 1 % GEL Apply 2 g topically 4 (four) times daily.    . diclofenac sodium (VOLTAREN) 1 % GEL Apply 2 g topically 4 (four) times daily. 100 g 2  . meloxicam (MOBIC) 15 MG tablet Take 1 tablet (15 mg total) by  mouth daily. 30 tablet 2  . Naproxen Sodium (ALEVE PO) Take by mouth as needed.     No facility-administered medications prior to visit.    Review of Systems   Patient denies headache, fevers, malaise, unintentional weight loss, skin rash, eye pain, sinus congestion and sinus pain, sore throat, dysphagia,  hemoptysis , cough, dyspnea, wheezing, chest pain, palpitations, orthopnea, edema, abdominal pain, nausea, melena, diarrhea, constipation, flank pain, dysuria, hematuria, urinary  Frequency, nocturia, numbness, tingling, seizures,  Focal weakness, Loss of consciousness,  Tremor, insomnia, depression, anxiety, and suicidal ideation.    Objective:  BP 100/78 mmHg  Pulse 63  Temp(Src) 98.1 F (36.7 C) (Oral)  Resp 12  Ht 5\' 5"  (1.651 m)  Wt 105 lb 12 oz (47.968 kg)  BMI 17.60 kg/m2  SpO2 98%   Physical Exam  General Appearance:    Alert, cooperative, no distress, appears stated age  Head:    Normocephalic, without obvious abnormality, atraumatic  Eyes:    PERRL, conjunctiva/corneas clear, EOM's intact, fundi    benign, both eyes  Ears:    Normal TM's and external ear canals, both ears  Nose:   Nares normal, septum midline, mucosa normal, no drainage    or sinus tenderness  Throat:   Lips, mucosa, and tongue normal; teeth and gums normal  Neck:   Supple, symmetrical, trachea midline, no adenopathy;    thyroid:  no enlargement/tenderness/nodules; no carotid   bruit or JVD  Back:     Symmetric, no curvature, ROM normal, no CVA tenderness  Lungs:     Clear to auscultation bilaterally, respirations unlabored  Chest Wall:    No tenderness or deformity   Heart:    Regular rate and rhythm, S1 and S2 normal, no murmur, rub   or gallop  Breast Exam:    No tenderness, masses, or nipple abnormality  Abdomen:     Soft, non-tender, bowel sounds active all four quadrants,    no masses, no organomegaly  Genitalia:    Pelvic: cervix normal in appearance, external genitalia normal, no adnexal  masses or tenderness, no cervical motion tenderness, rectovaginal septum normal, uterus normal size, shape, and consistency and vagina normal without discharge  Extremities:   Extremities normal, atraumatic, no cyanosis or edema  Pulses:   2+ and symmetric all extremities  Skin:   Skin color, texture, turgor normal, no rashes or lesions  Lymph nodes:   Cervical, supraclavicular, and axillary nodes normal  Neurologic:   CNII-XII intact, normal strength, sensation and reflexes    throughout     Assessment & Plan:   Problem List Items Addressed This Visit    Visit for preventive health examination    Annual comprehensive preventive exam was done as well as an evaluation and management of chronic conditions .  During the course of the visit the patient was educated and counseled about appropriate screening and preventive services including :  diabetes screening, lipid analysis with projected  10 year  risk for CAD , nutrition counseling, breast, cervical and colorectal cancer screening, and recommended immunizations.  Printed recommendations for health maintenance screenings was given.  Lab Results  Component Value Date   TSH 2.87 06/03/2015   Lab  Results  Component Value Date   CHOL 256* 06/03/2015   HDL 109.60 06/03/2015   LDLCALC 135* 06/03/2015   LDLDIRECT 116.0 06/03/2015   TRIG 53.0 06/03/2015   CHOLHDL 2 06/03/2015         Postmenopausal atrophic vaginitis    Trial of Premarin vaginal cream for dyspareunia secondary to vaginal dryness that did not improved with OTC lubricants.       Other Visit Diagnoses    Dupuytren's contracture of right hand    -  Primary    Relevant Orders    Ambulatory referral to Orthopedic Surgery    Other fatigue        Relevant Orders    Comprehensive metabolic panel (Completed)    TSH (Completed)    CBC with Differential/Platelet (Completed)    Hyperlipidemia        Relevant Orders    Lipid panel (Completed)    LDL cholesterol, direct  (Completed)    Vitamin D deficiency        Relevant Orders    VITAMIN D 25 Hydroxy (Vit-D Deficiency, Fractures) (Completed)    Osteopenia        Relevant Orders    DG Bone Density    Breast cancer screening        Relevant Orders    MM DIGITAL SCREENING BILATERAL    Postmenopause atrophic vaginitis        Relevant Medications    conjugated estrogens (PREMARIN) vaginal cream    Other Relevant Orders    Cytology - PAP    Cervical cancer screening        Relevant Orders    Cytology - PAP       I am having Ms. Fuentes start on conjugated estrogens. I am also having her maintain her cholecalciferol, Naproxen Sodium (ALEVE PO), diclofenac sodium, diclofenac sodium, meloxicam, clobetasol, and PICATO.  Meds ordered this encounter  Medications  . clobetasol (TEMOVATE) 0.05 % external solution    Sig: USE 1-2 APPLICATIONS APPLY ON THE SKIN DAILY. APPLY TO AFFECTED AREA OF SCALP AS NEEDED    Refill:  11  . PICATO 0.05 % GEL    Sig: APPLY ON THE SKIN AS DIRECTED FOR 2 NIGHTS    Refill:  1  . conjugated estrogens (PREMARIN) vaginal cream    Sig: Place 1 Applicatorful vaginally daily.    Dispense:  42.5 g    Refill:  12    There are no discontinued medications.  Follow-up: No Follow-up on file.   Crecencio Mc, MD

## 2015-06-05 DIAGNOSIS — N952 Postmenopausal atrophic vaginitis: Secondary | ICD-10-CM | POA: Insufficient documentation

## 2015-06-05 LAB — CBC WITH DIFFERENTIAL/PLATELET
Basophils Absolute: 0 10*3/uL (ref 0.0–0.1)
Basophils Relative: 0.3 % (ref 0.0–3.0)
EOS ABS: 0.1 10*3/uL (ref 0.0–0.7)
Eosinophils Relative: 2.1 % (ref 0.0–5.0)
HCT: 45 % (ref 36.0–46.0)
Hemoglobin: 14.3 g/dL (ref 12.0–15.0)
LYMPHS PCT: 33.4 % (ref 12.0–46.0)
Lymphs Abs: 1.9 10*3/uL (ref 0.7–4.0)
MCHC: 31.8 g/dL (ref 30.0–36.0)
MCV: 97.2 fl (ref 78.0–100.0)
MONOS PCT: 10.9 % (ref 3.0–12.0)
Monocytes Absolute: 0.6 10*3/uL (ref 0.1–1.0)
Neutro Abs: 3.1 10*3/uL (ref 1.4–7.7)
Neutrophils Relative %: 53.3 % (ref 43.0–77.0)
Platelets: 235 10*3/uL (ref 150.0–400.0)
RBC: 4.63 Mil/uL (ref 3.87–5.11)
RDW: 13.4 % (ref 11.5–15.5)
WBC: 5.8 10*3/uL (ref 4.0–10.5)

## 2015-06-05 NOTE — Assessment & Plan Note (Addendum)
Annual comprehensive preventive exam was done as well as an evaluation and management of chronic conditions .  During the course of the visit the patient was educated and counseled about appropriate screening and preventive services including :  diabetes screening, lipid analysis with projected  10 year  risk for CAD , nutrition counseling, breast, cervical and colorectal cancer screening, and recommended immunizations.  Printed recommendations for health maintenance screenings was given.  Lab Results  Component Value Date   TSH 2.87 06/03/2015   Lab Results  Component Value Date   CHOL 256* 06/03/2015   HDL 109.60 06/03/2015   LDLCALC 135* 06/03/2015   LDLDIRECT 116.0 06/03/2015   TRIG 53.0 06/03/2015   CHOLHDL 2 06/03/2015

## 2015-06-05 NOTE — Assessment & Plan Note (Signed)
Trial of Premarin vaginal cream for dyspareunia secondary to vaginal dryness that did not improved with OTC lubricants.

## 2015-06-09 ENCOUNTER — Encounter: Payer: Self-pay | Admitting: *Deleted

## 2015-06-10 LAB — CYTOLOGY - PAP

## 2015-06-15 ENCOUNTER — Other Ambulatory Visit: Payer: Self-pay | Admitting: Internal Medicine

## 2015-06-15 ENCOUNTER — Encounter: Payer: Self-pay | Admitting: Internal Medicine

## 2015-06-15 ENCOUNTER — Ambulatory Visit
Admission: RE | Admit: 2015-06-15 | Discharge: 2015-06-15 | Disposition: A | Discharge status: Home / Self Care | Payer: Managed Care, Other (non HMO) | Source: Ambulatory Visit | Attending: Internal Medicine | Admitting: Internal Medicine

## 2015-06-15 DIAGNOSIS — M810 Age-related osteoporosis without current pathological fracture: Secondary | ICD-10-CM | POA: Insufficient documentation

## 2015-06-15 DIAGNOSIS — Z13820 Encounter for screening for osteoporosis: Secondary | ICD-10-CM | POA: Insufficient documentation

## 2015-06-15 DIAGNOSIS — Z1239 Encounter for other screening for malignant neoplasm of breast: Secondary | ICD-10-CM

## 2015-06-15 DIAGNOSIS — M8580 Other specified disorders of bone density and structure, unspecified site: Secondary | ICD-10-CM

## 2015-06-15 DIAGNOSIS — Z1231 Encounter for screening mammogram for malignant neoplasm of breast: Secondary | ICD-10-CM | POA: Insufficient documentation

## 2015-06-17 ENCOUNTER — Encounter: Payer: Self-pay | Admitting: Internal Medicine

## 2016-04-04 ENCOUNTER — Telehealth: Payer: Self-pay | Admitting: *Deleted

## 2016-04-04 DIAGNOSIS — E559 Vitamin D deficiency, unspecified: Secondary | ICD-10-CM

## 2016-04-04 DIAGNOSIS — Z79899 Other long term (current) drug therapy: Secondary | ICD-10-CM

## 2016-04-04 DIAGNOSIS — R5383 Other fatigue: Secondary | ICD-10-CM

## 2016-04-04 DIAGNOSIS — E7800 Pure hypercholesterolemia, unspecified: Secondary | ICD-10-CM

## 2016-04-04 NOTE — Telephone Encounter (Signed)
Pt requested a medication refill for meloxicam Pharmacy [facility] [address]

## 2016-04-05 DIAGNOSIS — Z79899 Other long term (current) drug therapy: Secondary | ICD-10-CM | POA: Insufficient documentation

## 2016-04-05 DIAGNOSIS — E785 Hyperlipidemia, unspecified: Secondary | ICD-10-CM | POA: Insufficient documentation

## 2016-04-05 MED ORDER — MELOXICAM 15 MG PO TABS: 15.0000 mg | ORAL_TABLET | Freq: Every day | ORAL | 0 refills | Status: DC

## 2016-04-05 NOTE — Telephone Encounter (Signed)
Pt does not have a vm set up, unable to schedule physical or labs.

## 2016-04-05 NOTE — Telephone Encounter (Signed)
Refilled for Dr Randel Books.  Fasting labs ordered,  Needs labs and annual exam in feb/march please schedule

## 2016-04-05 NOTE — Telephone Encounter (Signed)
No labs since 06/03/15 no OV same date. OK to fill Meloxicam ?

## 2016-04-05 NOTE — Telephone Encounter (Signed)
Please schedule

## 2016-04-06 NOTE — Telephone Encounter (Signed)
Tried to schedule lab and annual exam. Can not get pt on phone. Pt does not have vm.

## 2016-04-06 NOTE — Telephone Encounter (Signed)
Pt called and stated that she does not have her schedule in front of her. She will call back to make appt.

## 2016-04-25 DIAGNOSIS — M545 Low back pain: Secondary | ICD-10-CM | POA: Diagnosis not present

## 2016-04-25 DIAGNOSIS — M5412 Radiculopathy, cervical region: Secondary | ICD-10-CM | POA: Diagnosis not present

## 2016-05-08 DIAGNOSIS — Z1283 Encounter for screening for malignant neoplasm of skin: Secondary | ICD-10-CM | POA: Diagnosis not present

## 2016-05-08 DIAGNOSIS — L570 Actinic keratosis: Secondary | ICD-10-CM | POA: Diagnosis not present

## 2016-05-08 DIAGNOSIS — L578 Other skin changes due to chronic exposure to nonionizing radiation: Secondary | ICD-10-CM | POA: Diagnosis not present

## 2016-05-08 DIAGNOSIS — L814 Other melanin hyperpigmentation: Secondary | ICD-10-CM | POA: Diagnosis not present

## 2016-06-06 ENCOUNTER — Ambulatory Visit (INDEPENDENT_AMBULATORY_CARE_PROVIDER_SITE_OTHER): Payer: 59 | Admitting: Internal Medicine

## 2016-06-06 ENCOUNTER — Encounter: Payer: Self-pay | Admitting: Internal Medicine

## 2016-06-06 ENCOUNTER — Telehealth: Payer: Self-pay | Admitting: Internal Medicine

## 2016-06-06 VITALS — BP 90/60 | HR 52 | Resp 16 | Ht 65.0 in | Wt 107.0 lb

## 2016-06-06 DIAGNOSIS — E1169 Type 2 diabetes mellitus with other specified complication: Secondary | ICD-10-CM | POA: Diagnosis not present

## 2016-06-06 DIAGNOSIS — Z0000 Encounter for general adult medical examination without abnormal findings: Secondary | ICD-10-CM

## 2016-06-06 DIAGNOSIS — Z1239 Encounter for other screening for malignant neoplasm of breast: Secondary | ICD-10-CM

## 2016-06-06 DIAGNOSIS — R636 Underweight: Secondary | ICD-10-CM

## 2016-06-06 DIAGNOSIS — E785 Hyperlipidemia, unspecified: Secondary | ICD-10-CM

## 2016-06-06 DIAGNOSIS — Z1231 Encounter for screening mammogram for malignant neoplasm of breast: Secondary | ICD-10-CM

## 2016-06-06 DIAGNOSIS — R5383 Other fatigue: Secondary | ICD-10-CM | POA: Diagnosis not present

## 2016-06-06 DIAGNOSIS — E559 Vitamin D deficiency, unspecified: Secondary | ICD-10-CM | POA: Diagnosis not present

## 2016-06-06 DIAGNOSIS — N952 Postmenopausal atrophic vaginitis: Secondary | ICD-10-CM

## 2016-06-06 LAB — COMPREHENSIVE METABOLIC PANEL
ALBUMIN: 4.6 g/dL (ref 3.5–5.2)
ALT: 14 U/L (ref 0–35)
AST: 19 U/L (ref 0–37)
Alkaline Phosphatase: 76 U/L (ref 39–117)
BILIRUBIN TOTAL: 0.5 mg/dL (ref 0.2–1.2)
BUN: 13 mg/dL (ref 6–23)
CALCIUM: 9.8 mg/dL (ref 8.4–10.5)
CO2: 32 mEq/L (ref 19–32)
Chloride: 104 mEq/L (ref 96–112)
Creatinine, Ser: 0.74 mg/dL (ref 0.40–1.20)
GFR: 85.42 mL/min (ref >60.00)
Glucose, Bld: 92 mg/dL (ref 70–99)
Potassium: 4.5 mEq/L (ref 3.5–5.1)
Sodium: 140 mEq/L (ref 135–145)
TOTAL PROTEIN: 6.5 g/dL (ref 6.0–8.3)

## 2016-06-06 LAB — CBC WITH DIFFERENTIAL/PLATELET
Basophils Absolute: 0 10*3/uL (ref 0.0–0.1)
Basophils Relative: 0.9 % (ref 0.0–3.0)
EOS ABS: 0.1 10*3/uL (ref 0.0–0.7)
Eosinophils Relative: 2.9 % (ref 0.0–5.0)
HCT: 42.9 % (ref 36.0–46.0)
HEMOGLOBIN: 14.4 g/dL (ref 12.0–15.0)
Lymphocytes Relative: 27.7 % (ref 12.0–46.0)
Lymphs Abs: 1.2 10*3/uL (ref 0.7–4.0)
MCHC: 33.5 g/dL (ref 30.0–36.0)
MCV: 92.7 fl (ref 78.0–100.0)
MONO ABS: 0.4 10*3/uL (ref 0.1–1.0)
Monocytes Relative: 9.8 % (ref 3.0–12.0)
NEUTROS PCT: 58.7 % (ref 43.0–77.0)
Neutro Abs: 2.5 10*3/uL (ref 1.4–7.7)
Platelets: 192 10*3/uL (ref 150.0–400.0)
RBC: 4.62 Mil/uL (ref 3.87–5.11)
RDW: 13.3 % (ref 11.5–15.5)
WBC: 4.3 10*3/uL (ref 4.0–10.5)

## 2016-06-06 LAB — VITAMIN D 25 HYDROXY (VIT D DEFICIENCY, FRACTURES): VITD: 34.86 ng/mL (ref 30.00–100.00)

## 2016-06-06 LAB — LIPID PANEL
CHOLESTEROL: 258 mg/dL — AB (ref 0–200)
HDL: 113.5 mg/dL (ref >39.00)
LDL CALC: 135 mg/dL — AB (ref 0–99)
NonHDL: 144.64
TRIGLYCERIDES: 50 mg/dL (ref 0.0–149.0)
Total CHOL/HDL Ratio: 2
VLDL: 10 mg/dL (ref 0.0–40.0)

## 2016-06-06 LAB — TSH: TSH: 2.49 u[IU]/mL (ref 0.35–4.50)

## 2016-06-06 NOTE — Progress Notes (Signed)
Patient ID: Katrina Fuentes, female    DOB: [birth year]  Age: 61 y.o. MRN: [id number]  The patient is here for annual Medicare wellness examination and management of other chronic and acute problems.  Pap normal 2017 mammo normal 2017 dense breasts prior breast biopsy sankar  Colonoscopy 2014         The risk factors are reflected in the social history.  The roster of all physicians providing medical care to patient - is listed in the Snapshot section of the chart.   Home safety : The patient has smoke detectors in the home. They wear seatbelts.  There are no firearms at home. There is no violence in the home.   There is no risks for hepatitis, STDs or HIV. There is no   history of blood transfusion. They have no travel history to infectious disease endemic areas of the world.  The patient has seen their dentist in the last six month. They have seen their eye doctor in the last year.     They do not  have excessive sun exposure. Discussed the need for sun protection: hats, long sleeves and use of sunscreen if there is significant sun exposure.   Diet: the importance of a healthy diet is discussed. They do have a healthy diet.  The benefits of regular aerobic exercise were discussed.   Depression screen: there are no signs or vegative symptoms of depression- irritability, change in appetite, anhedonia, sadness/tearfullness.  The following portions of the patient's history were reviewed and updated as appropriate: allergies, current medications, past family history, past medical history,  past surgical history, past social history  and problem list.  Visual acuity was not assessed per patient preference since she has regular follow up with her ophthalmologist. Hearing and body mass index were assessed and reviewed.   During the course of the visit the patient was educated and counseled about appropriate screening and preventive services including : fall prevention , diabetes screening,  nutrition counseling, colorectal cancer screening, and recommended immunizations.    CC: The primary encounter diagnosis was Breast cancer screening. Diagnoses of Fatigue, unspecified type, Vitamin D deficiency, Hyperlipidemia associated with type 2 diabetes mellitus ([place]), Visit for preventive health examination, Postmenopausal atrophic vaginitis, and Mildly underweight adult were also pertinent to this visit.  History Katrina Fuentes has a past medical history of Degenerative disk disease (2009) and Neuromuscular disorder ([place]).   She has a past surgical history that includes Breast biopsy (Left, 2012).   Her family history includes Cancer (age of onset: 59) in her father; Hyperlipidemia in her father; Stroke in her father.She reports that she has never smoked. She has never used smokeless tobacco. She reports that she drinks about 4.2 oz of alcohol per week . She reports that she does not use drugs.  Outpatient Medications Prior to Visit  Medication Sig Dispense Refill  . cholecalciferol (VITAMIN D) 1000 UNITS tablet Take 1,000 Units by mouth daily.    . clobetasol (TEMOVATE) 0.05 % external solution USE 1-2 APPLICATIONS APPLY ON THE SKIN DAILY. APPLY TO AFFECTED AREA OF SCALP AS NEEDED  11  . conjugated estrogens (PREMARIN) vaginal cream Place 1 Applicatorful vaginally daily. 42.5 g 12  . diclofenac sodium (VOLTAREN) 1 % GEL Apply 2 g topically 4 (four) times daily.    . meloxicam (MOBIC) 15 MG tablet Take 1 tablet (15 mg total) by mouth daily. 90 tablet 0  . Naproxen Sodium (ALEVE PO) Take by mouth as needed.    . diclofenac  sodium (VOLTAREN) 1 % GEL Apply 2 g topically 4 (four) times daily. 100 g 2  . PICATO 0.05 % GEL APPLY ON THE SKIN AS DIRECTED FOR 2 NIGHTS  1   No facility-administered medications prior to visit.     Review of Systems   Patient denies headache, fevers, malaise, unintentional weight loss, skin rash, eye pain, sinus congestion and sinus pain, sore throat, dysphagia,   hemoptysis , cough, dyspnea, wheezing, chest pain, palpitations, orthopnea, edema, abdominal pain, nausea, melena, diarrhea, constipation, flank pain, dysuria, hematuria, urinary  Frequency, nocturia, numbness, tingling, seizures,  Focal weakness, Loss of consciousness,  Tremor, insomnia, depression, anxiety, and suicidal ideation.     Objective:  BP 90/60   Pulse (!) 52   Resp 16   Ht 5\' 5"  (1.651 m)   Wt 107 lb (48.5 kg)   SpO2 98%   BMI 17.81 kg/m   Physical Exam   General appearance: alert, cooperative and appears stated age Head: Normocephalic, without obvious abnormality, atraumatic Eyes: conjunctivae/corneas clear. PERRL, EOM's intact. Fundi benign. Ears: normal TM's and external ear canals both ears Nose: Nares normal. Septum midline. Mucosa normal. No drainage or sinus tenderness. Throat: lips, mucosa, and tongue normal; teeth and gums normal Neck: no adenopathy, no carotid bruit, no JVD, supple, symmetrical, trachea midline and thyroid not enlarged, symmetric, no tenderness/mass/nodules Lungs: clear to auscultation bilaterally Breasts: normal appearance, no masses or tenderness Heart: regular rate and rhythm, S1, S2 normal, no murmur, click, rub or gallop Abdomen: soft, non-tender; bowel sounds normal; no masses,  no organomegaly Extremities: extremities normal, atraumatic, no cyanosis or edema Pulses: 2+ and symmetric Skin: Skin color, texture, turgor normal. No rashes or lesions Neurologic: Alert and oriented X 3, normal strength and tone. Normal symmetric reflexes. Normal coordination and gait.      Assessment & Plan:   Problem List Items Addressed This Visit    Mildly underweight adult     I have reviewed her diet and recommended that she increase her protein and fat intake while monitoring her carbohydrates.   Lab Results  Component Value Date   TSH 2.49 06/06/2016   Lab Results  Component Value Date   ALT 14 06/06/2016   AST 19 06/06/2016   ALKPHOS 76  06/06/2016   BILITOT 0.5 06/06/2016   Lab Results  Component Value Date   CREATININE 0.74 06/06/2016         Postmenopausal atrophic vaginitis    Managed with Premarin vaginal cream for dyspareunia secondary to vaginal dryness that did not improved with OTC lubricants.      Visit for preventive health examination    Annual comprehensive preventive exam was done as well as an evaluation and management of chronic conditions .  During the course of the visit the patient was educated and counseled about appropriate screening and preventive services including :  diabetes screening, lipid analysis with projected  10 year  risk for CAD , nutrition counseling, breast, cervical and colorectal cancer screening, and recommended immunizations.  Printed recommendations for health maintenance screenings was given       Other Visit Diagnoses    Breast cancer screening    -  Primary   Relevant Orders   MM SCREENING BREAST TOMO BILATERAL   Fatigue, unspecified type       Relevant Orders   Comprehensive metabolic panel (Completed)   TSH (Completed)   CBC with Differential/Platelet (Completed)   Vitamin D deficiency       Relevant Orders  VITAMIN D 25 Hydroxy (Vit-D Deficiency, Fractures) (Completed)   Hyperlipidemia associated with type 2 diabetes mellitus ([place])       Relevant Orders   Lipid panel (Completed)      I am having Katrina Fuentes maintain her cholecalciferol, Naproxen Sodium (ALEVE PO), diclofenac sodium, clobetasol, PICATO, conjugated estrogens, meloxicam, and methocarbamol.  Meds ordered this encounter  Medications  . methocarbamol (ROBAXIN) 500 MG tablet    Sig: as needed.    Medications Discontinued During This Encounter  Medication Reason  . diclofenac sodium (VOLTAREN) 1 % GEL Duplicate    Follow-up: No Follow-up on file.   Crecencio Mc, MD

## 2016-06-06 NOTE — Telephone Encounter (Signed)
error 

## 2016-06-06 NOTE — Progress Notes (Signed)
Pre visit review using our clinic review tool, if applicable. No additional management support is needed unless otherwise documented below in the visit note. 

## 2016-06-06 NOTE — Patient Instructions (Addendum)
GOOD TO SEE YOU!!  HERES HOW YOU CAN REACH ME  Cell phone [phone]     3d mammogram to be ordered   Fasting labs

## 2016-06-09 ENCOUNTER — Encounter: Payer: Self-pay | Admitting: Internal Medicine

## 2016-06-09 DIAGNOSIS — R636 Underweight: Secondary | ICD-10-CM | POA: Insufficient documentation

## 2016-06-09 NOTE — Assessment & Plan Note (Signed)
Annual comprehensive preventive exam was done as well as an evaluation and management of chronic conditions .  During the course of the visit the patient was educated and counseled about appropriate screening and preventive services including :  diabetes screening, lipid analysis with projected  10 year  risk for CAD , nutrition counseling, breast, cervical and colorectal cancer screening, and recommended immunizations.  Printed recommendations for health maintenance screenings was given 

## 2016-06-09 NOTE — Assessment & Plan Note (Signed)
I have reviewed her diet and recommended that she increase her protein and fat intake while monitoring her carbohydrates.   Lab Results  Component Value Date   TSH 2.49 06/06/2016   Lab Results  Component Value Date   ALT 14 06/06/2016   AST 19 06/06/2016   ALKPHOS 76 06/06/2016   BILITOT 0.5 06/06/2016   Lab Results  Component Value Date   CREATININE 0.74 06/06/2016

## 2016-06-09 NOTE — Assessment & Plan Note (Signed)
Managed with Premarin vaginal cream for dyspareunia secondary to vaginal dryness that did not improved with OTC lubricants.

## 2016-07-11 ENCOUNTER — Other Ambulatory Visit: Payer: Self-pay | Admitting: Internal Medicine

## 2016-07-11 ENCOUNTER — Ambulatory Visit
Admission: RE | Admit: 2016-07-11 | Discharge: 2016-07-11 | Disposition: A | Discharge status: Home / Self Care | Payer: 59 | Source: Ambulatory Visit | Attending: Internal Medicine | Admitting: Internal Medicine

## 2016-07-11 DIAGNOSIS — Z1231 Encounter for screening mammogram for malignant neoplasm of breast: Secondary | ICD-10-CM | POA: Diagnosis present

## 2016-07-11 DIAGNOSIS — R928 Other abnormal and inconclusive findings on diagnostic imaging of breast: Secondary | ICD-10-CM | POA: Insufficient documentation

## 2016-07-11 DIAGNOSIS — N6489 Other specified disorders of breast: Secondary | ICD-10-CM

## 2016-07-11 DIAGNOSIS — Z1239 Encounter for other screening for malignant neoplasm of breast: Secondary | ICD-10-CM

## 2016-07-12 ENCOUNTER — Telehealth: Payer: Self-pay | Admitting: Internal Medicine

## 2016-07-12 DIAGNOSIS — R928 Other abnormal and inconclusive findings on diagnostic imaging of breast: Secondary | ICD-10-CM

## 2016-07-12 NOTE — Telephone Encounter (Signed)
Message left on Dr Sharene Skeans cell re left breast mammogram abnormal.

## 2016-07-19 ENCOUNTER — Ambulatory Visit
Admission: RE | Admit: 2016-07-19 | Discharge: 2016-07-19 | Disposition: A | Discharge status: Home / Self Care | Payer: 59 | Source: Ambulatory Visit | Attending: Internal Medicine | Admitting: Internal Medicine

## 2016-07-19 DIAGNOSIS — N6489 Other specified disorders of breast: Secondary | ICD-10-CM | POA: Insufficient documentation

## 2016-07-19 DIAGNOSIS — R928 Other abnormal and inconclusive findings on diagnostic imaging of breast: Secondary | ICD-10-CM | POA: Diagnosis not present

## 2016-08-29 DIAGNOSIS — L718 Other rosacea: Secondary | ICD-10-CM | POA: Diagnosis not present

## 2016-08-29 DIAGNOSIS — Z79899 Other long term (current) drug therapy: Secondary | ICD-10-CM | POA: Diagnosis not present

## 2017-02-27 ENCOUNTER — Telehealth: Payer: Self-pay | Admitting: Internal Medicine

## 2017-02-27 NOTE — Telephone Encounter (Signed)
Pt    Called   Requesting  The  Date  Of  Her  Last  tdap     Injection. Info  Given to  Patient  After  Reviewing  Chart  Info .

## 2017-02-28 ENCOUNTER — Other Ambulatory Visit: Payer: Self-pay | Admitting: Internal Medicine

## 2017-02-28 MED ORDER — AMOXICILLIN-POT CLAVULANATE 875-125 MG PO TABS: 1.0000 | ORAL_TABLET | Freq: Two times a day (BID) | ORAL | 0 refills | Status: DC

## 2017-02-28 NOTE — Progress Notes (Signed)
[facility]

## 2017-04-20 IMAGING — MG MM DIGITAL DIAGNOSTIC UNILAT*L* W/ TOMO W/ CAD
9 of 12 series · 9 of 28 positions shown · non-contrast
Comparison: Previous exam(s).

CLINICAL DATA: Callback from screening mammogram for possible
asymmetry

EXAM:
2D DIGITAL DIAGNOSTIC LEFT MAMMOGRAM WITH CAD AND ADJUNCT TOMO
ULTRASOUND LEFT BREAST

[L ML (1 of 3)]
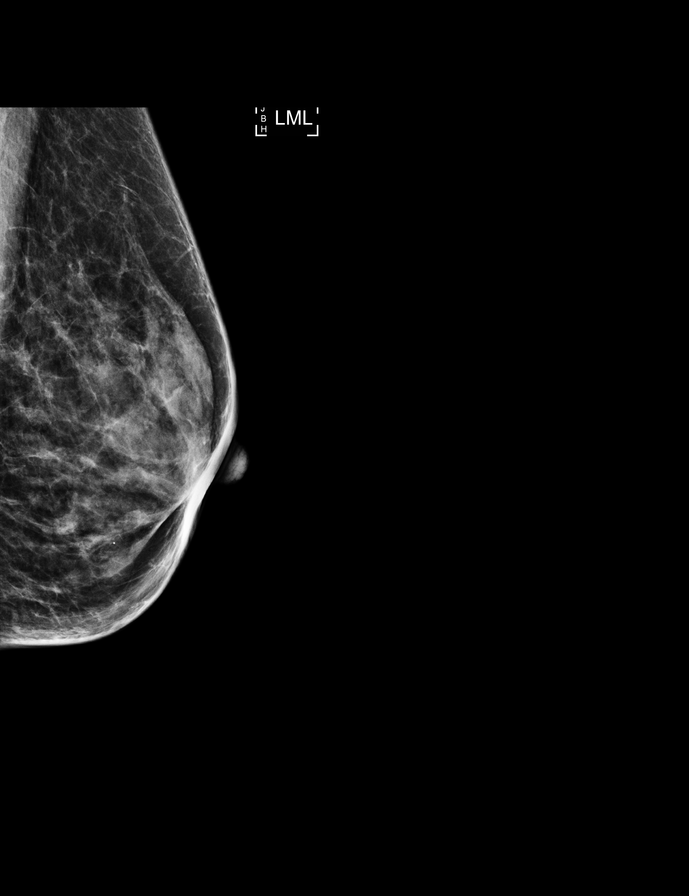

[L ML (2 of 3)]
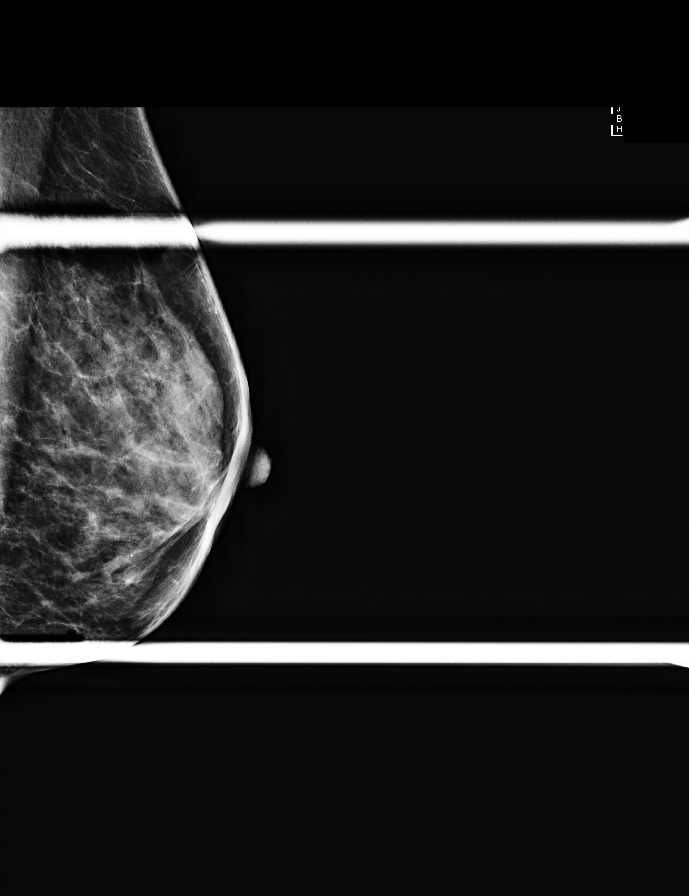

[L ML synth-2D (1 of 3)]
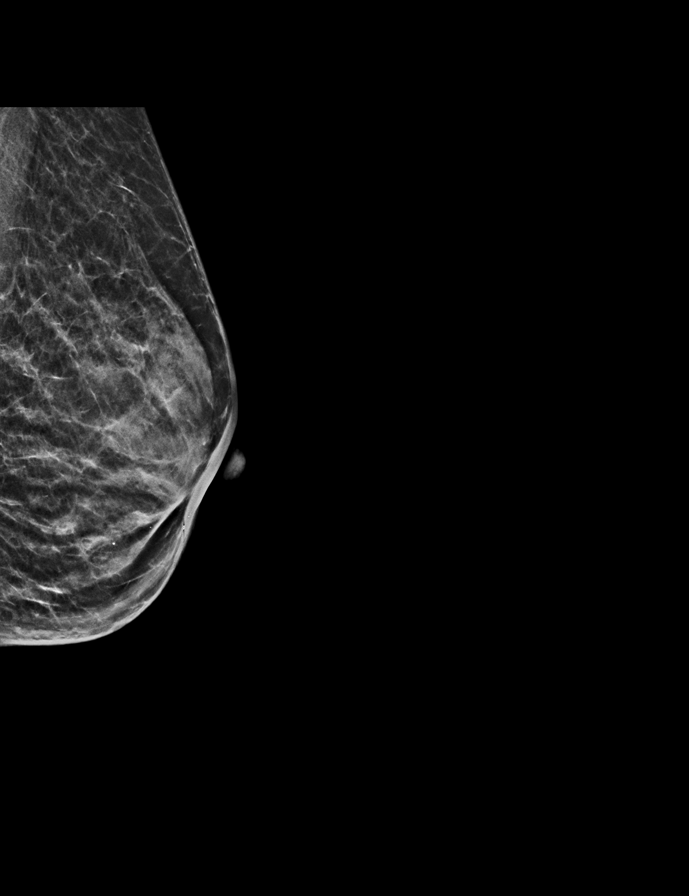

[L ML synth-2D (2 of 3)]
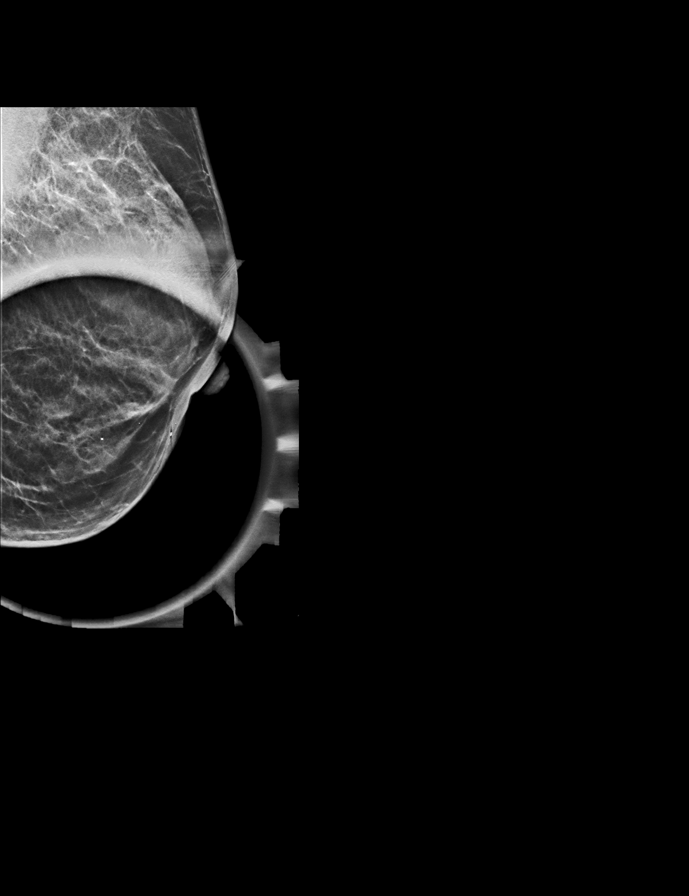

[L ML (3 of 3)]
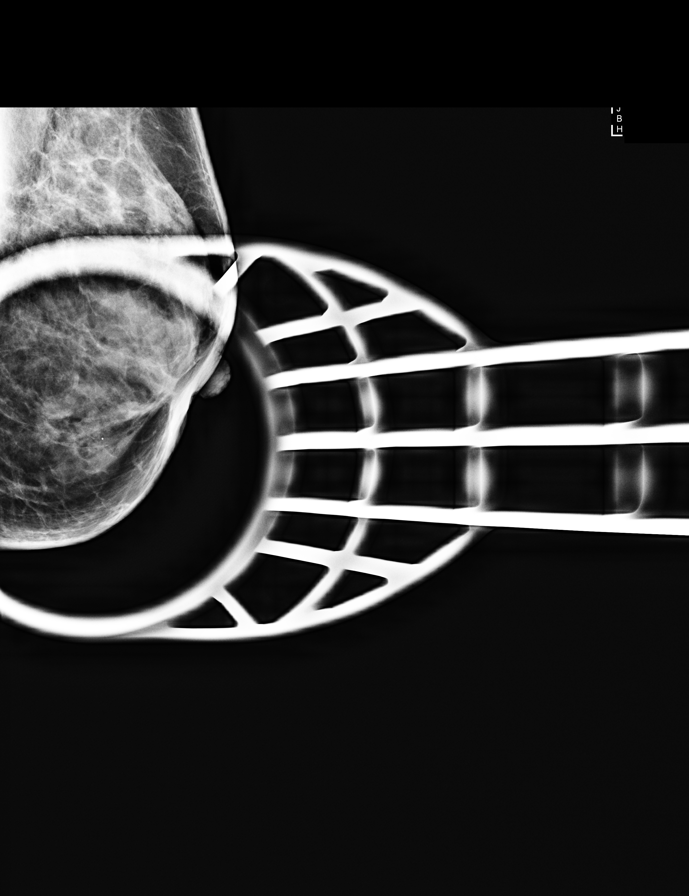

[L ML synth-2D (3 of 3)]
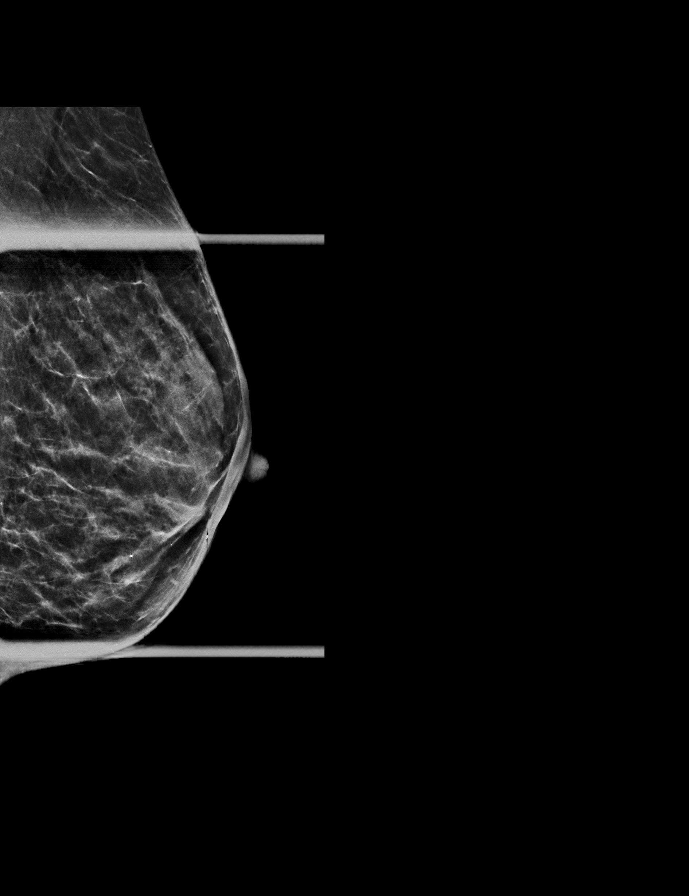

[L CC]
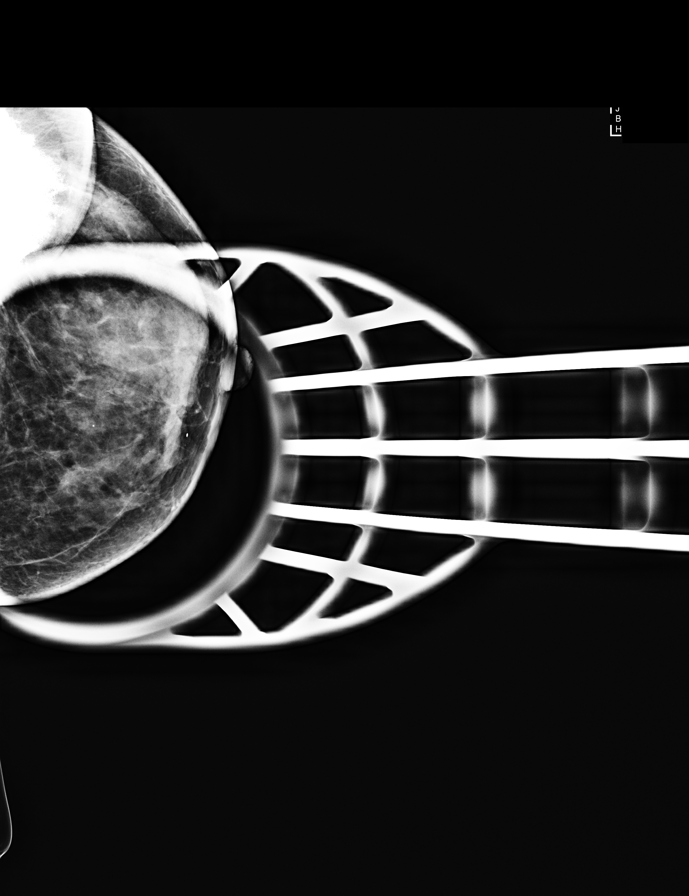

[L CC synth-2D]
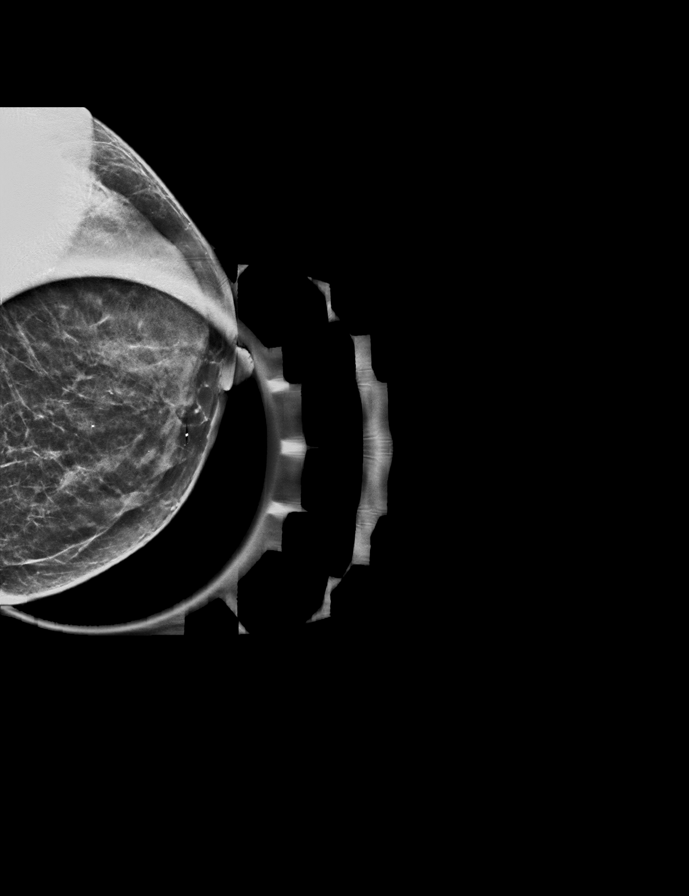

[L ML tomo · tomo slice 21/42.0]
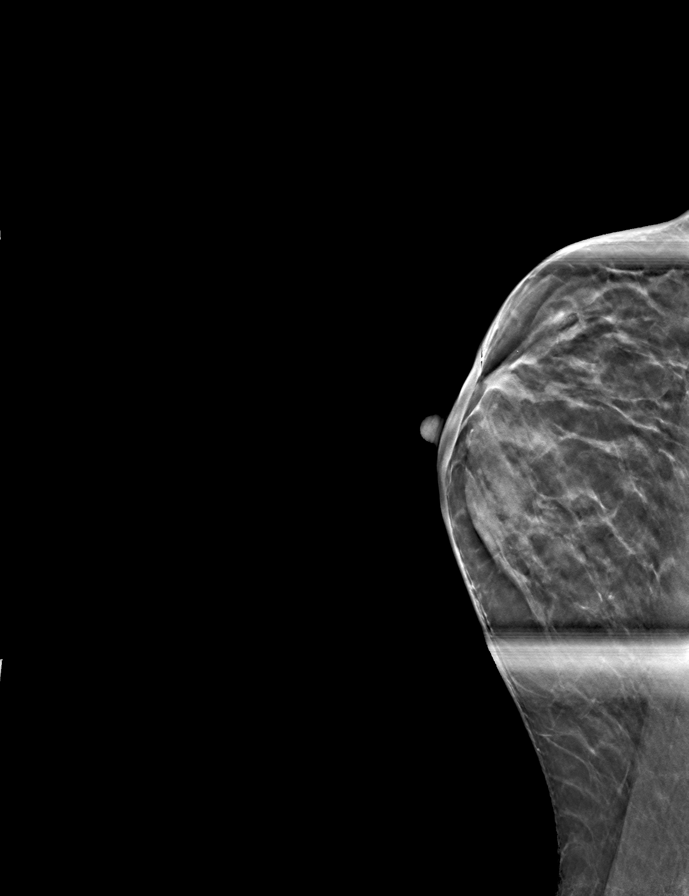

[9 of 28 positions shown; findings below may reference images not displayed]

ACR Breast Density Category c: The breast tissue is heterogeneously
dense, which may obscure small masses.
FINDINGS: Additional views of the left breast demonstrate no suspicious mass
or other abnormality in the area of concern in the medial left
breast. No suspicious mass, calcifications, or other abnormality is
identified.

Mammographic images were processed with CAD.

On physical exam, no discrete mass is felt in the area of concern in
the medial left breast.

Targeted ultrasound of the upper, inner and lower, inner left breast
was performed demonstrating no suspicious cystic or solid
sonographic finding in the area of concern.
IMPRESSION: No mammographic or sonographic evidence of malignancy.

RECOMMENDATION:
Screening mammogram in one year.(Code:[id number])

I have discussed the findings and recommendations with the patient.
Results were also provided in writing at the conclusion of the
visit. If applicable, a reminder letter will be sent to the patient
regarding the next appointment.

BI-RADS CATEGORY  1: Negative.

## 2017-05-28 DIAGNOSIS — D229 Melanocytic nevi, unspecified: Secondary | ICD-10-CM | POA: Diagnosis not present

## 2017-05-28 DIAGNOSIS — Z1283 Encounter for screening for malignant neoplasm of skin: Secondary | ICD-10-CM | POA: Diagnosis not present

## 2017-05-28 DIAGNOSIS — L308 Other specified dermatitis: Secondary | ICD-10-CM | POA: Diagnosis not present

## 2017-06-10 ENCOUNTER — Other Ambulatory Visit: Payer: Self-pay | Admitting: Internal Medicine

## 2017-06-10 DIAGNOSIS — Z1231 Encounter for screening mammogram for malignant neoplasm of breast: Secondary | ICD-10-CM

## 2017-07-15 ENCOUNTER — Ambulatory Visit
Admission: RE | Admit: 2017-07-15 | Discharge: 2017-07-15 | Disposition: A | Discharge status: Home / Self Care | Payer: 59 | Source: Ambulatory Visit | Attending: Internal Medicine | Admitting: Internal Medicine

## 2017-07-15 DIAGNOSIS — Z1231 Encounter for screening mammogram for malignant neoplasm of breast: Secondary | ICD-10-CM | POA: Insufficient documentation

## 2017-12-25 ENCOUNTER — Encounter: Payer: 59 | Admitting: Internal Medicine

## 2018-02-11 ENCOUNTER — Telehealth: Payer: Self-pay | Admitting: Internal Medicine

## 2018-02-11 MED ORDER — AMOXICILLIN-POT CLAVULANATE 875-125 MG PO TABS: 1.0000 | ORAL_TABLET | Freq: Two times a day (BID) | ORAL | 0 refills | Status: DC

## 2018-02-11 NOTE — Telephone Encounter (Signed)
Copied from [place] [phone]. Topic: Quick Communication - See Telephone Encounter >> Feb 11, 2018  9:50 AM Sheran Luz wrote: CRM for notification. See Telephone encounter for: 02/11/18.  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 Derrel Nip send in rx for amoxicillin stating she needs to go back to work tomorrow. Please advise.  Pharmacy-   [id number] [address], [place]  [phone] (Phone) [phone] (Fax)

## 2018-02-11 NOTE — Telephone Encounter (Signed)
[facility]  Text messaged Dr Randel Books

## 2018-03-26 ENCOUNTER — Encounter: Payer: Self-pay | Admitting: Internal Medicine

## 2018-03-26 ENCOUNTER — Ambulatory Visit (INDEPENDENT_AMBULATORY_CARE_PROVIDER_SITE_OTHER): Payer: 59 | Admitting: Internal Medicine

## 2018-03-26 VITALS — BP 90/58 | HR 74 | Temp 98.0°F | Resp 14 | Ht 65.0 in | Wt 107.4 lb

## 2018-03-26 DIAGNOSIS — Z0000 Encounter for general adult medical examination without abnormal findings: Secondary | ICD-10-CM | POA: Diagnosis not present

## 2018-03-26 DIAGNOSIS — Z13820 Encounter for screening for osteoporosis: Secondary | ICD-10-CM

## 2018-03-26 DIAGNOSIS — Z1239 Encounter for other screening for malignant neoplasm of breast: Secondary | ICD-10-CM

## 2018-03-26 DIAGNOSIS — R636 Underweight: Secondary | ICD-10-CM

## 2018-03-26 DIAGNOSIS — Z1159 Encounter for screening for other viral diseases: Secondary | ICD-10-CM

## 2018-03-26 DIAGNOSIS — E7800 Pure hypercholesterolemia, unspecified: Secondary | ICD-10-CM | POA: Diagnosis not present

## 2018-03-26 MED ORDER — ZOSTER VAC RECOMB ADJUVANTED 50 MCG/0.5ML IM SUSR: 0.5000 mL | Freq: Once | INTRAMUSCULAR | 1 refills | Status: AC

## 2018-03-26 MED ORDER — DICLOFENAC SODIUM 1 % TD GEL: 2.0000 g | Freq: Four times a day (QID) | TRANSDERMAL | 6 refills | Status: DC

## 2018-03-26 NOTE — Patient Instructions (Signed)
Good to see you Dr Randel Books!  The ShingRx vaccine is now available in local pharmacies and is 97% ee=ffective in preventing shingles   It is therefore ADVISED for all interested adults over 50 to prevent shingles   Your annual mammogram is due in March and your 3 yr follow up DEXA have   been ordered.  You are encouraged to call to make your appointment at [facility]  Health Maintenance for Postmenopausal Women Menopause is a normal process in which your reproductive ability comes to an end. This process happens gradually over a span of months to years, usually between the ages of 60 and 69. Menopause is complete when you have missed 12 consecutive menstrual periods. It is important to talk with your health care provider about some of the most common conditions that affect postmenopausal women, such as heart disease, cancer, and bone loss (osteoporosis). Adopting a healthy lifestyle and getting preventive care can help to promote your health and wellness. Those actions can also lower your chances of developing some of these common conditions. What should I know about menopause? During menopause, you may experience a number of symptoms, such as:  Moderate-to-severe hot flashes.  Night sweats.  Decrease in sex drive.  Mood swings.  Headaches.  Tiredness.  Irritability.  Memory problems.  Insomnia.  Choosing to treat or not to treat menopausal changes is an individual decision that you make with your health care provider. What should I know about hormone replacement therapy and supplements? Hormone therapy products are effective for treating symptoms that are associated with menopause, such as hot flashes and night sweats. Hormone replacement carries certain risks, especially as you become older. If you are thinking about using estrogen or estrogen with progestin treatments, discuss the benefits and risks with your health care provider. What should I know about heart disease  and stroke? Heart disease, heart attack, and stroke become more likely as you age. This may be due, in part, to the hormonal changes that your body experiences during menopause. These can affect how your body processes dietary fats, triglycerides, and cholesterol. Heart attack and stroke are both medical emergencies. There are many things that you can do to help prevent heart disease and stroke:  Have your blood pressure checked at least every 1-2 years. High blood pressure causes heart disease and increases the risk of stroke.  If you are 25-3 years old, ask your health care provider if you should take aspirin to prevent a heart attack or a stroke.  Do not use any tobacco products, including cigarettes, chewing tobacco, or electronic cigarettes. If you need help quitting, ask your health care provider.  It is important to eat a healthy diet and maintain a healthy weight. ? Be sure to include plenty of vegetables, fruits, low-fat dairy products, and lean protein. ? Avoid eating foods that are high in solid fats, added sugars, or salt (sodium).  Get regular exercise. This is one of the most important things that you can do for your health. ? Try to exercise for at least 150 minutes each week. The type of exercise that you do should increase your heart rate and make you sweat. This is known as moderate-intensity exercise. ? Try to do strengthening exercises at least twice each week. Do these in addition to the moderate-intensity exercise.  Know your numbers.Ask your health care provider to check your cholesterol and your blood glucose. Continue to have your blood tested as directed by your health care provider.  What should I know about cancer screening? There are several types of cancer. Take the following steps to reduce your risk and to catch any cancer development as early as possible. Breast Cancer  Practice breast self-awareness. ? This means understanding how your breasts normally  appear and feel. ? It also means doing regular breast self-exams. Let your health care provider know about any changes, no matter how small.  If you are 24 or older, have a clinician do a breast exam (clinical breast exam or CBE) every year. Depending on your age, family history, and medical history, it may be recommended that you also have a yearly breast X-ray (mammogram).  If you have a family history of breast cancer, talk with your health care provider about genetic screening.  If you are at high risk for breast cancer, talk with your health care provider about having an MRI and a mammogram every year.  Breast cancer (BRCA) gene test is recommended for women who have family members with BRCA-related cancers. Results of the assessment will determine the need for genetic counseling and BRCA1 and for BRCA2 testing. BRCA-related cancers include these types: ? Breast. This occurs in males or females. ? Ovarian. ? Tubal. This may also be called fallopian tube cancer. ? Cancer of the abdominal or pelvic lining (peritoneal cancer). ? Prostate. ? Pancreatic.  Cervical, Uterine, and Ovarian Cancer Your health care provider may recommend that you be screened regularly for cancer of the pelvic organs. These include your ovaries, uterus, and vagina. This screening involves a pelvic exam, which includes checking for microscopic changes to the surface of your cervix (Pap test).  For women ages 21-65, health care providers may recommend a pelvic exam and a Pap test every three years. For women ages 104-65, they may recommend the Pap test and pelvic exam, combined with testing for human papilloma virus (HPV), every five years. Some types of HPV increase your risk of cervical cancer. Testing for HPV may also be done on women of any age who have unclear Pap test results.  Other health care providers may not recommend any screening for nonpregnant women who are considered low risk for pelvic cancer and have no  symptoms. Ask your health care provider if a screening pelvic exam is right for you.  If you have had past treatment for cervical cancer or a condition that could lead to cancer, you need Pap tests and screening for cancer for at least 20 years after your treatment. If Pap tests have been discontinued for you, your risk factors (such as having a new sexual partner) need to be reassessed to determine if you should start having screenings again. Some women have medical problems that increase the chance of getting cervical cancer. In these cases, your health care provider may recommend that you have screening and Pap tests more often.  If you have a family history of uterine cancer or ovarian cancer, talk with your health care provider about genetic screening.  If you have vaginal bleeding after reaching menopause, tell your health care provider.  There are currently no reliable tests available to screen for ovarian cancer.  Lung Cancer Lung cancer screening is recommended for adults 29-41 years old who are at high risk for lung cancer because of a history of smoking. A yearly low-dose CT scan of the lungs is recommended if you:  Currently smoke.  Have a history of at least 30 pack-years of smoking and you currently smoke or have quit within the past  15 years. A pack-year is smoking an average of one pack of cigarettes per day for one year.  Yearly screening should:  Continue until it has been 15 years since you quit.  Stop if you develop a health problem that would prevent you from having lung cancer treatment.  Colorectal Cancer  This type of cancer can be detected and can often be prevented.  Routine colorectal cancer screening usually begins at age 66 and continues through age 56.  If you have risk factors for colon cancer, your health care provider may recommend that you be screened at an earlier age.  If you have a family history of colorectal cancer, talk with your health care  provider about genetic screening.  Your health care provider may also recommend using home test kits to check for hidden blood in your stool.  A small camera at the end of a tube can be used to examine your colon directly (sigmoidoscopy or colonoscopy). This is done to check for the earliest forms of colorectal cancer.  Direct examination of the colon should be repeated every 5-10 years until age 73. However, if early forms of precancerous polyps or small growths are found or if you have a family history or genetic risk for colorectal cancer, you may need to be screened more often.  Skin Cancer  Check your skin from head to toe regularly.  Monitor any moles. Be sure to tell your health care provider: ? About any new moles or changes in moles, especially if there is a change in a mole's shape or color. ? If you have a mole that is larger than the size of a pencil eraser.  If any of your family members has a history of skin cancer, especially at a young age, talk with your health care provider about genetic screening.  Always use sunscreen. Apply sunscreen liberally and repeatedly throughout the day.  Whenever you are outside, protect yourself by wearing long sleeves, pants, a wide-brimmed hat, and sunglasses.  What should I know about osteoporosis? Osteoporosis is a condition in which bone destruction happens more quickly than new bone creation. After menopause, you may be at an increased risk for osteoporosis. To help prevent osteoporosis or the bone fractures that can happen because of osteoporosis, the following is recommended:  If you are 15-39 years old, get at least 1,000 mg of calcium and at least 600 mg of vitamin D per day.  If you are older than age 73 but younger than age 8, get at least 1,200 mg of calcium and at least 600 mg of vitamin D per day.  If you are older than age 48, get at least 1,200 mg of calcium and at least 800 mg of vitamin D per day.  Smoking and excessive  alcohol intake increase the risk of osteoporosis. Eat foods that are rich in calcium and vitamin D, and do weight-bearing exercises several times each week as directed by your health care provider. What should I know about how menopause affects my mental health? Depression may occur at any age, but it is more common as you become older. Common symptoms of depression include:  Low or sad mood.  Changes in sleep patterns.  Changes in appetite or eating patterns.  Feeling an overall lack of motivation or enjoyment of activities that you previously enjoyed.  Frequent crying spells.  Talk with your health care provider if you think that you are experiencing depression. What should I know about immunizations? It is important  that you get and maintain your immunizations. These include:  Tetanus, diphtheria, and pertussis (Tdap) booster vaccine.  Influenza every year before the flu season begins.  Pneumonia vaccine.  Shingles vaccine.  Your health care provider may also recommend other immunizations. This information is not intended to replace advice given to you by your health care provider. Make sure you discuss any questions you have with your health care provider. Document Released: 06/01/2005 Document Revised: 10/28/2015 Document Reviewed: 01/11/2015 [employer]vier Interactive Patient Education  2018 [employer]  

## 2018-03-26 NOTE — Progress Notes (Signed)
Patient ID: Katrina Fuentes, female    DOB: [birth year]  Age: 62 y.o. MRN: [id number]  The patient is here for annual Medicare wellness examination and management of other chronic and acute problems.   DISCUSSED GETTING DEXA AND MAMMOGRAM NEXT MARCH  Overdue for colonoscopy Last eye exam one year ago  Annual skin exam done in January    The risk factors are reflected in the social history.  The roster of all physicians providing medical care to patient - is listed in the Snapshot section of the chart.  Activities of daily living:  The patient is 100% independent in all ADLs: dressing, toileting, feeding as well as independent mobility  Home safety : The patient has smoke detectors in the home. They wear seatbelts.  There are no firearms at home. There is no violence in the home.   There is no risks for hepatitis, STDs or HIV. There is no   history of blood transfusion. They have no travel history to infectious disease endemic areas of the world.  The patient has seen their dentist in the last six month. They have seen their eye doctor in the last year. They admit to slight hearing difficulty with regard to whispered voices and some television programs.  They have deferred audiologic testing in the last year.  They do not  have excessive sun exposure. Discussed the need for sun protection: hats, long sleeves and use of sunscreen if there is significant sun exposure.   Diet: the importance of a healthy diet is discussed. They do have a healthy diet.  The benefits of regular aerobic exercise were discussed. She walks 4 times per week ,  20 minutes.   Depression screen: there are no signs or vegative symptoms of depression- irritability, change in appetite, anhedonia, sadness/tearfullness.  Cognitive assessment: the patient manages all their financial and personal affairs and is actively engaged. They could relate day,date,year and events; recalled 2/3 objects at 3 minutes; performed clock-face  test normally.  The following portions of the patient's history were reviewed and updated as appropriate: allergies, current medications, past family history, past medical history,  past surgical history, past social history  and problem list.  Visual acuity was not assessed per patient preference since she has regular follow up with her ophthalmologist. Hearing and body mass index were assessed and reviewed.   During the course of the visit the patient was educated and counseled about appropriate screening and preventive services including : fall prevention , diabetes screening, nutrition counseling, colorectal cancer screening, and recommended immunizations.    CC: The primary encounter diagnosis was Pure hypercholesterolemia. Diagnoses of Screening for measles, Underweight, Visit for preventive health examination, Screening for osteoporosis, and Breast cancer screening were also pertinent to this visit.  History Katrina Fuentes has a past medical history of Degenerative disk disease (2009) and Neuromuscular disorder ([place]).   She has a past surgical history that includes Breast biopsy (Left, 2012).   Her family history includes Cancer (age of onset: 28) in her father; Hyperlipidemia in her father; Stroke in her father.She reports that she has never smoked. She has never used smokeless tobacco. She reports that she drinks about 7.0 standard drinks of alcohol per week. She reports that she does not use drugs.  Outpatient Medications Prior to Visit  Medication Sig Dispense Refill  . cholecalciferol (VITAMIN D) 1000 UNITS tablet Take 1,000 Units by mouth daily.    . clobetasol (TEMOVATE) 0.05 % external solution USE 1-2 APPLICATIONS APPLY ON THE SKIN  DAILY. APPLY TO AFFECTED AREA OF SCALP AS NEEDED  11  . conjugated estrogens (PREMARIN) vaginal cream Place 1 Applicatorful vaginally daily. 42.5 g 12  . meloxicam (MOBIC) 15 MG tablet Take 1 tablet (15 mg total) by mouth daily. 90 tablet 0  . methocarbamol  (ROBAXIN) 500 MG tablet as needed.    . Naproxen Sodium (ALEVE PO) Take by mouth as needed.    . diclofenac sodium (VOLTAREN) 1 % GEL Apply 2 g topically 4 (four) times daily.    Marland Kitchen amoxicillin-clavulanate (AUGMENTIN) 875-125 MG tablet Take 1 tablet by mouth 2 (two) times daily. (Patient not taking: Reported on 03/26/2018) 14 tablet 0  . PICATO 0.05 % GEL APPLY ON THE SKIN AS DIRECTED FOR 2 NIGHTS  1   No facility-administered medications prior to visit.     Review of Systems   Patient denies headache, fevers, malaise, unintentional weight loss, skin rash, eye pain, sinus congestion and sinus pain, sore throat, dysphagia,  hemoptysis , cough, dyspnea, wheezing, chest pain, palpitations, orthopnea, edema, abdominal pain, nausea, melena, diarrhea, constipation, flank pain, dysuria, hematuria, urinary  Frequency, nocturia, numbness, tingling, seizures,  Focal weakness, Loss of consciousness,  Tremor, insomnia, depression, anxiety, and suicidal ideation.      Objective:  BP (!) 90/58 (BP Location: Left Arm, Patient Position: Sitting, Cuff Size: Normal)   Pulse 74   Temp 98 F (36.7 C) (Oral)   Resp 14   Ht 5\' 5"  (1.651 m)   Wt 107 lb 6.4 oz (48.7 kg)   SpO2 98%   BMI 17.87 kg/m   Physical Exam   General appearance: alert, cooperative and appears stated age Ears: normal TM's and external ear canals both ears Throat: lips, mucosa, and tongue normal; teeth and gums normal Neck: no adenopathy, no carotid bruit, supple, symmetrical, trachea midline and thyroid not enlarged, symmetric, no tenderness/mass/nodules Back: symmetric, no curvature. ROM normal. No CVA tenderness. Lungs: clear to auscultation bilaterally Heart: regular rate and rhythm, S1, S2 normal, no murmur, click, rub or gallop Abdomen: soft, non-tender; bowel sounds normal; no masses,  no organomegaly Pulses: 2+ and symmetric Skin: Skin color, texture, turgor normal. No rashes or lesions Lymph nodes: Cervical,  supraclavicular, and axillary nodes normal.    Assessment & Plan:   Problem List Items Addressed This Visit    Hyperlipidemia - Primary   Relevant Orders   Lipid panel (Completed)   Visit for preventive health examination    .Annual comprehensive preventive exam was done as well as an evaluation and management of chronic conditions .  During the course of the visit the patient was educated and counseled about appropriate screening and preventive services including :  diabetes screening, lipid analysis with projected  10 year  risk for CAD  Of 2.4%  , nutrition counseling, breast, cervical and colorectal cancer screening, and recommended immunizations.  Printed recommendations for health maintenance screenings was given  Lab Results  Component Value Date   CHOL 267 (H) 03/26/2018   HDL 108.40 03/26/2018   LDLCALC 146 (H) 03/26/2018   LDLDIRECT 116.0 06/03/2015   TRIG 66.0 03/26/2018   CHOLHDL 2 03/26/2018  .       Other Visit Diagnoses    Screening for measles       Relevant Orders   Measles/Mumps/Rubella Immunity (Completed)   Underweight       Relevant Orders   TSH (Completed)   Comprehensive metabolic panel (Completed)   Screening for osteoporosis       Relevant  Orders   DG Bone Density   Breast cancer screening       Relevant Orders   MM 3D SCREEN BREAST BILATERAL      I have discontinued Katrina Fuentes's PICATO and amoxicillin-clavulanate. I am also having her start on Zoster Vaccine Adjuvanted. Additionally, I am having her maintain her cholecalciferol, Naproxen Sodium (ALEVE PO), clobetasol, conjugated estrogens, meloxicam, methocarbamol, and diclofenac sodium.  Meds ordered this encounter  Medications  . diclofenac sodium (VOLTAREN) 1 % GEL    Sig: Apply 2 g topically 4 (four) times daily.    Dispense:  100 g    Refill:  6  . Zoster Vaccine Adjuvanted [facility]) injection    Sig: Inject 0.5 mLs into the muscle once for 1 dose.    Dispense:  1 each     Refill:  1    Medications Discontinued During This Encounter  Medication Reason  . amoxicillin-clavulanate (AUGMENTIN) 875-125 MG tablet Completed Course  . PICATO 0.05 % GEL Completed Course  . diclofenac sodium (VOLTAREN) 1 % GEL Reorder    Follow-up: Return in about 1 year (around 03/27/2019).   Crecencio Mc, MD

## 2018-03-27 LAB — TSH: TSH: 2.99 u[IU]/mL (ref 0.35–4.50)

## 2018-03-27 LAB — MEASLES/MUMPS/RUBELLA IMMUNITY
MUMPS IGG: 91.6 [AU]/ml
Rubella: 0.94 index — ABNORMAL LOW
Rubeola IgG: 77.4 AU/mL

## 2018-03-27 LAB — COMPREHENSIVE METABOLIC PANEL
ALBUMIN: 4.6 g/dL (ref 3.5–5.2)
ALT: 18 U/L (ref 0–35)
AST: 24 U/L (ref 0–37)
Alkaline Phosphatase: 70 U/L (ref 39–117)
BUN: 18 mg/dL (ref 6–23)
CO2: 29 mEq/L (ref 19–32)
CREATININE: 0.75 mg/dL (ref 0.40–1.20)
Calcium: 10.1 mg/dL (ref 8.4–10.5)
Chloride: 103 mEq/L (ref 96–112)
GFR: 83.59 mL/min (ref >60.00)
GLUCOSE: 86 mg/dL (ref 70–99)
POTASSIUM: 4.5 meq/L (ref 3.5–5.1)
SODIUM: 140 meq/L (ref 135–145)
Total Bilirubin: 0.6 mg/dL (ref 0.2–1.2)
Total Protein: 7.3 g/dL (ref 6.0–8.3)

## 2018-03-27 LAB — LIPID PANEL
CHOLESTEROL: 267 mg/dL — AB (ref 0–200)
HDL: 108.4 mg/dL (ref >39.00)
LDL CALC: 146 mg/dL — AB (ref 0–99)
NONHDL: 158.89
Total CHOL/HDL Ratio: 2
Triglycerides: 66 mg/dL (ref 0.0–149.0)
VLDL: 13.2 mg/dL (ref 0.0–40.0)

## 2018-03-29 NOTE — Assessment & Plan Note (Signed)
.  Annual comprehensive preventive exam was done as well as an evaluation and management of chronic conditions .  During the course of the visit the patient was educated and counseled about appropriate screening and preventive services including :  diabetes screening, lipid analysis with projected  10 year  risk for CAD  Of 2.4%  , nutrition counseling, breast, cervical and colorectal cancer screening, and recommended immunizations.  Printed recommendations for health maintenance screenings was given  Lab Results  Component Value Date   CHOL 267 (H) 03/26/2018   HDL 108.40 03/26/2018   LDLCALC 146 (H) 03/26/2018   LDLDIRECT 116.0 06/03/2015   TRIG 66.0 03/26/2018   CHOLHDL 2 03/26/2018  .

## 2018-05-12 ENCOUNTER — Encounter: Payer: Self-pay | Admitting: Internal Medicine

## 2018-05-27 DIAGNOSIS — Z1283 Encounter for screening for malignant neoplasm of skin: Secondary | ICD-10-CM | POA: Diagnosis not present

## 2018-05-27 DIAGNOSIS — D229 Melanocytic nevi, unspecified: Secondary | ICD-10-CM | POA: Diagnosis not present

## 2018-05-27 DIAGNOSIS — L570 Actinic keratosis: Secondary | ICD-10-CM | POA: Diagnosis not present

## 2018-05-27 DIAGNOSIS — L400 Psoriasis vulgaris: Secondary | ICD-10-CM | POA: Diagnosis not present

## 2018-07-24 ENCOUNTER — Other Ambulatory Visit: Payer: 59

## 2018-09-02 ENCOUNTER — Other Ambulatory Visit: Payer: 59

## 2018-10-12 ENCOUNTER — Emergency Department
Admission: EM | Admit: 2018-10-12 | Discharge: 2018-10-12 | Disposition: A | Discharge status: Home / Self Care | Payer: 59 | Attending: Emergency Medicine | Admitting: Emergency Medicine

## 2018-10-12 ENCOUNTER — Encounter: Payer: Self-pay | Admitting: Emergency Medicine

## 2018-10-12 ENCOUNTER — Other Ambulatory Visit: Payer: Self-pay

## 2018-10-12 DIAGNOSIS — R52 Pain, unspecified: Secondary | ICD-10-CM | POA: Diagnosis present

## 2018-10-12 DIAGNOSIS — Z20828 Contact with and (suspected) exposure to other viral communicable diseases: Secondary | ICD-10-CM | POA: Diagnosis not present

## 2018-10-12 DIAGNOSIS — Z79899 Other long term (current) drug therapy: Secondary | ICD-10-CM | POA: Insufficient documentation

## 2018-10-12 DIAGNOSIS — B349 Viral infection, unspecified: Secondary | ICD-10-CM | POA: Insufficient documentation

## 2018-10-12 LAB — SARS CORONAVIRUS 2 BY RT PCR (HOSPITAL ORDER, PERFORMED IN ~~LOC~~ HOSPITAL LAB): SARS Coronavirus 2: NEGATIVE

## 2018-10-12 NOTE — ED Triage Notes (Signed)
Feeling "under the weather" x 1 day.  Running low grade fever at home.  Wishes to have Covid test.

## 2018-10-12 NOTE — ED Notes (Signed)
Patient states she had a Tmax of 100 degrees this morning and feels more fatigued and has chills today. Patient has had recent family functions over the last two days.

## 2018-10-12 NOTE — ED Provider Notes (Signed)
[facility] Emergency Department Provider Note  ____________________________________________  Time seen: Approximately 1:56 PM  I have reviewed the triage vital signs and the nursing notes.   HISTORY  Chief Complaint Generalized Body Aches   HPI TISHIE ALTMANN is a 63 y.o. female who presents to the emergency department for COVID 19 testing.  She states that she is just had some body aches, fatigue, chills, and a fever of approximately 100 degrees today.  She has recently attended some family functions.  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   Past Medical History:  Diagnosis Date  . Degenerative disk disease 2009   cervical  c4-5, c5-6 left sided bulge  . Neuromuscular disorder [facility])     Patient Active Problem List   Diagnosis Date Noted  . Mildly underweight adult 06/09/2016  . Hyperlipidemia 04/05/2016  . Postmenopausal atrophic vaginitis 06/05/2015  . Swelling of finger joint of right hand 01/18/2014  . Visit for preventive health examination 01/18/2014  . Degenerative disk disease     Past Surgical History:  Procedure Laterality Date  . BREAST BIOPSY Left 2012   by Jamal Collin    Prior to Admission medications   Medication Sig Start Date End Date Taking? Authorizing Provider  conjugated estrogens (PREMARIN) vaginal cream Place 1 Applicatorful vaginally daily. 06/03/15  Yes Crecencio Mc, MD  cholecalciferol (VITAMIN D) 1000 UNITS tablet Take 1,000 Units by mouth daily.    [provider]  clobetasol (TEMOVATE) 0.05 % external solution USE 1-2 APPLICATIONS APPLY ON THE SKIN DAILY. APPLY TO AFFECTED AREA OF SCALP AS NEEDED 05/11/15   [provider]  diclofenac sodium (VOLTAREN) 1 % GEL Apply 2 g topically 4 (four) times daily. 03/26/18   Crecencio Mc, MD  meloxicam (MOBIC) 15 MG tablet Take 1 tablet (15 mg total) by mouth daily. 04/05/16   Crecencio Mc, MD   methocarbamol (ROBAXIN) 500 MG tablet as needed. 04/25/16   [provider]  Naproxen Sodium (ALEVE PO) Take by mouth as needed.    [provider]    Allergies Patient has no known allergies.  Family History  Problem Relation Age of Onset  . Hyperlipidemia Father   . Cancer Father 15       T cell lymphoma  . Stroke Father        and PE   . Breast cancer Neg Hx     Social History Social History   Tobacco Use  . Smoking status: Never Smoker  . Smokeless tobacco: Never Used  Substance Use Topics  . Alcohol use: Yes    Alcohol/week: 7.0 standard drinks    Types: 7 Glasses of wine per week    Comment: wine  . Drug use: No    Review of Systems Constitutional: Positive for fever/chills.  Decreased appetite. ENT: Negative for sore throat. Cardiovascular: Denies chest pain. Respiratory: Negative for shortness of breath.  Negative for for cough.  No wheezing.  Gastrointestinal: Negative for nausea, no vomiting.  No diarrhea.  Musculoskeletal: Positive for body aches Skin: Negative for rash. Neurological: Negative for headaches ____________________________________________   PHYSICAL EXAM:  VITAL SIGNS: ED Triage Vitals  Enc Vitals Group     BP 10/12/18 1328 (!) 142/85     Pulse Rate 10/12/18 1328 84     Resp 10/12/18 1328 16     Temp 10/12/18 1328 98.4 F (36.9 C)  Temp Source 10/12/18 1328 Oral     SpO2 10/12/18 1328 96 %     Weight 10/12/18 1324 107 lb 5.8 oz (48.7 kg)     Height --      Head Circumference --      Peak Flow --      Pain Score 10/12/18 1323 0     Pain Loc --      Pain Edu? --      Excl. in [place]? --     Constitutional: Alert and oriented.  Well appearing and in no acute distress. Eyes: Conjunctivae are normal. Ears: Exam deferred Nose: No sinus congestion noted; no rhinnorhea. Mouth/Throat: Mucous membranes are moist.  Oropharynx clear. Tonsils not visualized. Uvula midline. Neck: No stridor.  Lymphatic: No cervical  lymphadenopathy. Cardiovascular: Normal rate, regular rhythm. Good peripheral circulation. Respiratory: Respirations are even and unlabored.  No retractions.  Breath sounds clear to auscultation. Gastrointestinal: Soft and nontender.  Musculoskeletal: FROM x 4 extremities.  Neurologic:  Normal speech and language. Skin:  Skin is warm, dry and intact. No rash noted. Psychiatric: Mood and affect are normal. Speech and behavior are normal.  ____________________________________________   LABS (all labs ordered are listed, but only abnormal results are displayed)  Labs Reviewed  SARS CORONAVIRUS 2 (HOSPITAL ORDER, [place] [facility])   ____________________________________________  EKG  Not indicated ____________________________________________  RADIOLOGY  Not indicated ____________________________________________   PROCEDURES  Procedure(s) performed: None  Critical Care performed: No ____________________________________________   INITIAL IMPRESSION / ASSESSMENT AND PLAN / ED COURSE  63 y.o. female presenting to the emergency department for COVID-19 testing.  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  YOLANDA HUFFSTETLER was evaluated in Emergency Department on 10/16/2018 for the symptoms described in the history of present illness. She was evaluated in the context of the global COVID-19 pandemic, which necessitated consideration that the patient might be at risk for infection with the SARS-CoV-2 virus that causes COVID-19. Institutional protocols and algorithms that pertain to the evaluation of patients at risk for COVID-19 are in a state of rapid change based on information released by regulatory bodies including the CDC and federal and state organizations. These policies and algorithms were followed during the patient's care in the ED.     Medications - No data to display  ED Discharge Orders    None        Pertinent labs & imaging results that were available during my care of the patient were reviewed by me and considered in my medical decision making (see chart for details).    If controlled substance prescribed during this visit, 12 month history viewed on the [place] prior to issuing an initial prescription for Schedule II or III opiod. ____________________________________________   FINAL CLINICAL IMPRESSION(S) / ED DIAGNOSES  Final diagnoses:  Viral syndrome    Note:  This document was prepared using Dragon voice recognition software and may include unintentional dictation errors.    Victorino Dike, FNP 10/16/18 1316    Nena Polio, MD 10/16/18 2052

## 2018-10-29 ENCOUNTER — Ambulatory Visit
Admission: RE | Admit: 2018-10-29 | Discharge: 2018-10-29 | Disposition: A | Discharge status: Home / Self Care | Payer: 59 | Source: Ambulatory Visit | Attending: Internal Medicine | Admitting: Internal Medicine

## 2018-10-29 DIAGNOSIS — Z13820 Encounter for screening for osteoporosis: Secondary | ICD-10-CM | POA: Diagnosis present

## 2018-10-29 DIAGNOSIS — Z1239 Encounter for other screening for malignant neoplasm of breast: Secondary | ICD-10-CM | POA: Insufficient documentation

## 2018-11-02 ENCOUNTER — Encounter: Payer: Self-pay | Admitting: Internal Medicine

## 2018-11-02 DIAGNOSIS — M810 Age-related osteoporosis without current pathological fracture: Secondary | ICD-10-CM | POA: Insufficient documentation

## 2019-05-08 ENCOUNTER — Encounter: Payer: Self-pay | Admitting: Internal Medicine

## 2019-05-08 ENCOUNTER — Other Ambulatory Visit (HOSPITAL_COMMUNITY)
Admission: RE | Admit: 2019-05-08 | Discharge: 2019-05-08 | Disposition: A | Discharge status: Home / Self Care | Payer: BLUE CROSS/BLUE SHIELD | Source: Ambulatory Visit | Attending: Internal Medicine | Admitting: Internal Medicine

## 2019-05-08 ENCOUNTER — Ambulatory Visit (INDEPENDENT_AMBULATORY_CARE_PROVIDER_SITE_OTHER): Payer: BC Managed Care – PPO | Admitting: Internal Medicine

## 2019-05-08 ENCOUNTER — Other Ambulatory Visit: Payer: Self-pay

## 2019-05-08 VITALS — BP 130/70 | HR 64 | Temp 97.0°F | Resp 14 | Ht 65.0 in | Wt 106.2 lb

## 2019-05-08 DIAGNOSIS — R0789 Other chest pain: Secondary | ICD-10-CM

## 2019-05-08 DIAGNOSIS — Z0000 Encounter for general adult medical examination without abnormal findings: Secondary | ICD-10-CM

## 2019-05-08 DIAGNOSIS — Z124 Encounter for screening for malignant neoplasm of cervix: Secondary | ICD-10-CM

## 2019-05-08 DIAGNOSIS — M810 Age-related osteoporosis without current pathological fracture: Secondary | ICD-10-CM

## 2019-05-08 DIAGNOSIS — Z0001 Encounter for general adult medical examination with abnormal findings: Secondary | ICD-10-CM

## 2019-05-08 MED ORDER — RALOXIFENE HCL 60 MG PO TABS: 60.0000 mg | ORAL_TABLET | Freq: Every day | ORAL | 5 refills | Status: DC

## 2019-05-08 NOTE — Progress Notes (Signed)
Patient ID: Katrina Fuentes, female    DOB: 29-Aug-1955  Age: 64 y.o. MRN: [id number]  The patient is here for annual preventive  examination and management of other chronic and acute problems.  This visit occurred during the SARS-CoV-2 public health emergency.  Safety protocols were in place, including screening questions prior to the visit, additional usage of staff PPE, and extensive cleaning of exam room while observing appropriate contact time as indicated for disinfecting solutions.   Needs colonoscopy asap [facility] Osteoporosis .  Discussed evista     The risk factors are reflected in the social history.  The roster of all physicians providing medical care to patient - is listed in the Snapshot section of the chart.  Activities of daily living:  The patient is 100% independent in all ADLs: dressing, toileting, feeding as well as independent mobility  Home safety : The patient has smoke detectors in the home. They wear seatbelts.  There are no firearms at home. There is no violence in the home.   There is no risks for hepatitis, STDs or HIV. There is no   history of blood transfusion. They have no travel history to infectious disease endemic areas of the world.  The patient has seen their dentist in the last six month. They have seen their eye doctor in the last year. They admit to slight hearing difficulty with regard to whispered voices and some television programs.  They have deferred audiologic testing in the last year.  They do not  have excessive sun exposure. Discussed the need for sun protection: hats, long sleeves and use of sunscreen if there is significant sun exposure.   Diet: the importance of a healthy diet is discussed. They do have a healthy diet.  The benefits of regular aerobic exercise were discussed. She walks 4 times per week ,  20 minutes.   Depression screen: there are no signs or vegative symptoms of depression- irritability, change in appetite,  anhedonia, sadness/tearfullness.  Cognitive assessment: the patient manages all their financial and personal affairs and is actively engaged. They could relate day,date,year and events; recalled 2/3 objects at 3 minutes; performed clock-face test normally.  The following portions of the patient's history were reviewed and updated as appropriate: allergies, current medications, past family history, past medical history,  past surgical history, past social history  and problem list.  Visual acuity was not assessed per patient preference since she has regular follow up with her ophthalmologist. Hearing and body mass index were assessed and reviewed.   During the course of the visit the patient was educated and counseled about appropriate screening and preventive services including : fall prevention , diabetes screening, nutrition counseling, colorectal cancer screening, and recommended immunizations.    CC: There were no encounter diagnoses.  Chest pain:  She has had 3 episodes of SSCP radiating to neck that have occurred in the past 12 months.  All occurring at rest. No diaphoresis, nausea or dyspnea.  Exercises (runs) regularl about 3 miles daily has never had an episode during exercise, but has a FH of CAD in father.  History Kmari has a past medical history of Degenerative disk disease (2009) and Neuromuscular disorder ([place]).   She has a past surgical history that includes Breast biopsy (Left, 2012).   Her family history includes Cancer (age of onset: 81) in her father; Hyperlipidemia in her father; Stroke in her father.She reports that she has never smoked. She has never used smokeless tobacco. She reports  current alcohol use of about 7.0 standard drinks of alcohol per week. She reports that she does not use drugs.  Outpatient Medications Prior to Visit  Medication Sig Dispense Refill  . cholecalciferol (VITAMIN D) 1000 UNITS tablet Take 1,000 Units by mouth daily.    . clobetasol  (TEMOVATE) 0.05 % external solution USE 1-2 APPLICATIONS APPLY ON THE SKIN DAILY. APPLY TO AFFECTED AREA OF SCALP AS NEEDED  11  . conjugated estrogens (PREMARIN) vaginal cream Place 1 Applicatorful vaginally daily. 42.5 g 12  . diclofenac sodium (VOLTAREN) 1 % GEL Apply 2 g topically 4 (four) times daily. 100 g 6  . meloxicam (MOBIC) 15 MG tablet Take 1 tablet (15 mg total) by mouth daily. 90 tablet 0  . Naproxen Sodium (ALEVE PO) Take by mouth as needed.    . methocarbamol (ROBAXIN) 500 MG tablet as needed.     No facility-administered medications prior to visit.    Review of Systems   Patient denies headache, fevers, malaise, unintentional weight loss, skin rash, eye pain, sinus congestion and sinus pain, sore throat, dysphagia,  hemoptysis , cough, dyspnea, wheezing, chest pain, palpitations, orthopnea, edema, abdominal pain, nausea, melena, diarrhea, constipation, flank pain, dysuria, hematuria, urinary  Frequency, nocturia, numbness, tingling, seizures,  Focal weakness, Loss of consciousness,  Tremor, insomnia, depression, anxiety, and suicidal ideation.      Objective:  BP 130/70 (BP Location: Left Arm, Patient Position: Sitting, Cuff Size: Normal)   Pulse 64   Temp (!) 97 F (36.1 C) (Oral)   Resp 14   Ht 5\' 5"  (1.651 m)   Wt 106 lb 3.2 oz (48.2 kg)   SpO2 98%   BMI 17.67 kg/m   Physical Exam  General Appearance:    Alert, cooperative, no distress, appears stated age  Head:    Normocephalic, without obvious abnormality, atraumatic  Eyes:    PERRL, conjunctiva/corneas clear, EOM's intact, fundi    benign, both eyes  Ears:    Normal TM's and external ear canals, both ears  Nose:   Nares normal, septum midline, mucosa normal, no drainage    or sinus tenderness  Throat:   Lips, mucosa, and tongue normal; teeth and gums normal  Neck:   Supple, symmetrical, trachea midline, no adenopathy;    thyroid:  no enlargement/tenderness/nodules; no carotid   bruit or JVD  Back:      Symmetric, no curvature, ROM normal, no CVA tenderness  Lungs:     Clear to auscultation bilaterally, respirations unlabored  Chest Wall:    No tenderness or deformity   Heart:    Regular rate and rhythm, S1 and S2 normal, no murmur, rub   or gallop  Breast Exam:    No tenderness, masses, or nipple abnormality  Abdomen:     Soft, non-tender, bowel sounds active all four quadrants,    no masses, no organomegaly  Genitalia:    Pelvic: cervix normal in appearance, external genitalia normal, no adnexal masses or tenderness, no cervical motion tenderness, rectovaginal septum normal, uterus normal size, shape, and consistency and vagina normal without discharge  Extremities:   Extremities normal, atraumatic, no cyanosis or edema  Pulses:   2+ and symmetric all extremities  Skin:   Skin color, texture, turgor normal, no rashes or lesions  Lymph nodes:   Cervical, supraclavicular, and axillary nodes normal  Neurologic:   CNII-XII intact, normal strength, sensation and reflexes    throughout     Assessment & Plan:   Problem List Items  Addressed This Visit    None      I am having Darthula S. Youkhana maintain her cholecalciferol, Naproxen Sodium (ALEVE PO), clobetasol, conjugated estrogens, meloxicam, methocarbamol, and diclofenac sodium.  No orders of the defined types were placed in this encounter.   There are no discontinued medications.  Follow-up: No follow-ups on file.   Crecencio Mc, MD

## 2019-05-08 NOTE — Patient Instructions (Signed)
Colonoscopy and cardiology referrals are in process  Refills done  Please consider Evista for the osteoporosis.  I sent a trial to your pharmacy   Health Maintenance for Postmenopausal Women Menopause is a normal process in which your ability to get pregnant comes to an end. This process happens slowly over many months or years, usually between the ages of 36 and 53. Menopause is complete when you have missed your menstrual periods for 12 months. It is important to talk with your health care provider about some of the most common conditions that affect women after menopause (postmenopausal women). These include heart disease, cancer, and bone loss (osteoporosis). Adopting a healthy lifestyle and getting preventive care can help to promote your health and wellness. The actions you take can also lower your chances of developing some of these common conditions. What should I know about menopause? During menopause, you may get a number of symptoms, such as:  Hot flashes. These can be moderate or severe.  Night sweats.  Decrease in sex drive.  Mood swings.  Headaches.  Tiredness.  Irritability.  Memory problems.  Insomnia. Choosing to treat or not to treat these symptoms is a decision that you make with your health care provider. Do I need hormone replacement therapy?  Hormone replacement therapy is effective in treating symptoms that are caused by menopause, such as hot flashes and night sweats.  Hormone replacement carries certain risks, especially as you become older. If you are thinking about using estrogen or estrogen with progestin, discuss the benefits and risks with your health care provider. What is my risk for heart disease and stroke? The risk of heart disease, heart attack, and stroke increases as you age. One of the causes may be a change in the body's hormones during menopause. This can affect how your body uses dietary fats, triglycerides, and cholesterol. Heart attack  and stroke are medical emergencies. There are many things that you can do to help prevent heart disease and stroke. Watch your blood pressure  High blood pressure causes heart disease and increases the risk of stroke. This is more likely to develop in people who have high blood pressure readings, are of African descent, or are overweight.  Have your blood pressure checked: ? Every 3-5 years if you are 26-40 years of age. ? Every year if you are 10 years old or older. Eat a healthy diet   Eat a diet that includes plenty of vegetables, fruits, low-fat dairy products, and lean protein.  Do not eat a lot of foods that are high in solid fats, added sugars, or sodium. Get regular exercise Get regular exercise. This is one of the most important things you can do for your health. Most adults should:  Try to exercise for at least 150 minutes each week. The exercise should increase your heart rate and make you sweat (moderate-intensity exercise).  Try to do strengthening exercises at least twice each week. Do these in addition to the moderate-intensity exercise.  Spend less time sitting. Even light physical activity can be beneficial. Other tips  Work with your health care provider to achieve or maintain a healthy weight.  Do not use any products that contain nicotine or tobacco, such as cigarettes, e-cigarettes, and chewing tobacco. If you need help quitting, ask your health care provider.  Know your numbers. Ask your health care provider to check your cholesterol and your blood sugar (glucose). Continue to have your blood tested as directed by your health care provider. Do  I need screening for cancer? Depending on your health history and family history, you may need to have cancer screening at different stages of your life. This may include screening for:  Breast cancer.  Cervical cancer.  Lung cancer.  Colorectal cancer. What is my risk for osteoporosis? After menopause, you may be  at increased risk for osteoporosis. Osteoporosis is a condition in which bone destruction happens more quickly than new bone creation. To help prevent osteoporosis or the bone fractures that can happen because of osteoporosis, you may take the following actions:  If you are 19-81 years old, get at least 1,000 mg of calcium and at least 600 mg of vitamin D per day.  If you are older than age 42 but younger than age 57, get at least 1,200 mg of calcium and at least 600 mg of vitamin D per day.  If you are older than age 22, get at least 1,200 mg of calcium and at least 800 mg of vitamin D per day. Smoking and drinking excessive alcohol increase the risk of osteoporosis. Eat foods that are rich in calcium and vitamin D, and do weight-bearing exercises several times each week as directed by your health care provider. How does menopause affect my mental health? Depression may occur at any age, but it is more common as you become older. Common symptoms of depression include:  Low or sad mood.  Changes in sleep patterns.  Changes in appetite or eating patterns.  Feeling an overall lack of motivation or enjoyment of activities that you previously enjoyed.  Frequent crying spells. Talk with your health care provider if you think that you are experiencing depression. General instructions See your health care provider for regular wellness exams and vaccines. This may include:  Scheduling regular health, dental, and eye exams.  Getting and maintaining your vaccines. These include: ? Influenza vaccine. Get this vaccine each year before the flu season begins. ? Pneumonia vaccine. ? Shingles vaccine. ? Tetanus, diphtheria, and pertussis (Tdap) booster vaccine. Your health care provider may also recommend other immunizations. Tell your health care provider if you have ever been abused or do not feel safe at home. Summary  Menopause is a normal process in which your ability to get pregnant comes to  an end.  This condition causes hot flashes, night sweats, decreased interest in sex, mood swings, headaches, or lack of sleep.  Treatment for this condition may include hormone replacement therapy.  Take actions to keep yourself healthy, including exercising regularly, eating a healthy diet, watching your weight, and checking your blood pressure and blood sugar levels.  Get screened for cancer and depression. Make sure that you are up to date with all your vaccines. This information is not intended to replace advice given to you by your health care provider. Make sure you discuss any questions you have with your health care provider. Document Revised: 04/02/2018 Document Reviewed: 04/02/2018 Elsevier Patient Education  2020 [employer].

## 2019-05-10 ENCOUNTER — Other Ambulatory Visit: Payer: Self-pay | Admitting: Internal Medicine

## 2019-05-10 DIAGNOSIS — Z1211 Encounter for screening for malignant neoplasm of colon: Secondary | ICD-10-CM

## 2019-05-10 DIAGNOSIS — R0789 Other chest pain: Secondary | ICD-10-CM | POA: Insufficient documentation

## 2019-05-10 NOTE — Assessment & Plan Note (Addendum)
Bone Density scores received, she has bone loss which is significant.    10 minutes  As spent with patient reviewing her report, personal history and the risks and benefits of various pharmacologic treatments versus waiting.  I recommended  treating with alendronate weekly dosing , increasing dietary calcium intake to 1800  mg daily, 2/3 through diet and 1/3 through supplements ,  Vitamin D, the amount of which to be dictated by periodic Vit D measurements, and  daily weight bearing exercise .

## 2019-05-10 NOTE — Assessment & Plan Note (Signed)

## 2019-05-10 NOTE — Assessment & Plan Note (Signed)
She has an excellent lipid profile historically and no history of hypertension or diabetes .  Referral to cardiology for noninvasive assessment including cardiac CT

## 2019-05-11 MED ORDER — MELOXICAM 15 MG PO TABS: 15.0000 mg | ORAL_TABLET | Freq: Every day | ORAL | 1 refills | Status: DC

## 2019-05-11 MED ORDER — DICLOFENAC SODIUM 1 % EX GEL: 2.0000 g | Freq: Four times a day (QID) | CUTANEOUS | 2 refills | Status: DC

## 2019-05-12 LAB — CYTOLOGY - PAP
Comment: NEGATIVE
Diagnosis: NEGATIVE
High risk HPV: NEGATIVE

## 2019-05-15 ENCOUNTER — Telehealth: Payer: Self-pay | Admitting: Internal Medicine

## 2019-05-15 NOTE — Telephone Encounter (Signed)
Jame Kuehnel Key: [id number] - Rx #[id number]  Drug Diclofenac Sodium 1% gel Form Blue Building control surveyor Form (CB) Original Claim Info 75 DRUG REQUIRES PRIOR AUTHORIZATION   The above was filed on [place] on 05/15/19

## 2019-05-19 ENCOUNTER — Encounter: Payer: Self-pay | Admitting: *Deleted

## 2019-05-22 ENCOUNTER — Ambulatory Visit: Payer: BC Managed Care – PPO | Admitting: Cardiology

## 2019-05-22 ENCOUNTER — Encounter: Payer: Self-pay | Admitting: Cardiology

## 2019-05-22 ENCOUNTER — Other Ambulatory Visit: Payer: Self-pay

## 2019-05-22 VITALS — BP 120/70 | HR 55 | Ht 64.5 in | Wt 107.0 lb

## 2019-05-22 DIAGNOSIS — R0789 Other chest pain: Secondary | ICD-10-CM | POA: Diagnosis not present

## 2019-05-22 NOTE — Patient Instructions (Signed)
Medication Instructions:  Your physician recommends that you continue on your current medications as directed. Please refer to the Current Medication list given to you today.  *If you need a refill on your cardiac medications before your next appointment, please call your pharmacy*  Lab Work: None ordered If you have labs (blood work) drawn today and your tests are completely normal, you will receive your results only by: . MyChart Message (if you have MyChart) OR . A paper copy in the mail If you have any lab test that is abnormal or we need to change your treatment, we will call you to review the results.  Testing/Procedures: None ordered  Follow-Up: At [facility], you and your health needs are our priority.  As part of our continuing mission to provide you with exceptional heart care, we have created designated Provider Care Teams.  These Care Teams include your primary Cardiologist (physician) and Advanced Practice Providers (APPs -  Physician Assistants and Nurse Practitioners) who all work together to provide you with the care you need, when you need it.  Your next appointment:   As needed   The format for your next appointment:   In Person  Provider:    You may see Brian Agbor-Etang, MD or one of the following Advanced Practice Providers on your designated Care Team:    Christopher Berge, NP  Ryan Dunn, PA-C  Jacquelyn Visser, PA-C   Other Instructions N/A  

## 2019-05-22 NOTE — Progress Notes (Signed)
Cardiology Office Note:    Date:  05/22/2019   ID:  IZONA STUDE, DOB 1955-06-05, MRN [id number]  PCP:  Crecencio Mc, MD  Cardiologist:  Kate Sable, MD  Electrophysiologist:  None   Referring MD: Crecencio Mc, MD   Chief Complaint  Patient presents with  . New Patient (Initial Visit)    Ref Dr. Derrel Nip for chest pain. Meds reviewed by the pt. verbally. Pt. c/o several episodes of chest discomfort for the past couple years.     History of Present Illness:    Katrina Fuentes is a 64 y.o. female with a hx of osteoporosis who presents due to chest pain.  Patient states having sporadic/infrequent symptoms of chest discomfort with some neck tightness over the past year.  Last occurrence was couple of days ago and prior occurrence was 3 months ago.  Symptoms are not related with exertion.  Patient rates discomfort as 3 out of 10.  She states symptoms are not really bothersome but just wanted to get checked out. she denies palpitations, edema, orthopnea, presyncope or syncope.  She is able to walk and perform all activities of daily living without any symptoms of chest pain or shortness of breath.  Denies any history of heart disease.  Denies ever smoking.  Her father has a history of aortic calcifications.  She denies any family history of early CAD.  Past Medical History:  Diagnosis Date  . Degenerative disk disease 2009   cervical  c4-5, c5-6 left sided bulge  . Neuromuscular disorder [facility])     Past Surgical History:  Procedure Laterality Date  . BREAST BIOPSY Left 2012   by Jamal Collin    Current Medications: Current Meds  Medication Sig  . cholecalciferol (VITAMIN D) 1000 UNITS tablet Take 1,000 Units by mouth daily.  . clobetasol (TEMOVATE) 0.05 % external solution USE 1-2 APPLICATIONS APPLY ON THE SKIN DAILY. APPLY TO AFFECTED AREA OF SCALP AS NEEDED  . conjugated estrogens (PREMARIN) vaginal cream Place 1 Applicatorful vaginally daily.  . diclofenac Sodium  (VOLTAREN) 1 % GEL Apply 2 g topically 4 (four) times daily.  . meloxicam (MOBIC) 15 MG tablet Take 1 tablet (15 mg total) by mouth daily.  . methocarbamol (ROBAXIN) 500 MG tablet as needed.  . Naproxen Sodium (ALEVE PO) Take by mouth as needed.  . raloxifene (EVISTA) 60 MG tablet Take 1 tablet (60 mg total) by mouth daily.     Allergies:   Patient has no known allergies.   Social History   Socioeconomic History  . Marital status: Married    Spouse name: Not on file  . Number of children: Not on file  . Years of education: Not on file  . Highest education level: Not on file  Occupational History  . Not on file  Tobacco Use  . Smoking status: Never Smoker  . Smokeless tobacco: Never Used  Substance and Sexual Activity  . Alcohol use: Yes    Alcohol/week: 7.0 standard drinks    Types: 7 Glasses of wine per week    Comment: wine  . Drug use: No  . Sexual activity: Not on file  Other Topics Concern  . Not on file  Social History Narrative  . Not on file   Social Determinants of Health   Financial Resource Strain:   . Difficulty of Paying Living Expenses: Not on file  Food Insecurity:   . Worried About Charity fundraiser in the Last Year: Not on file  .  Ran Out of Food in the Last Year: Not on file  Transportation Needs:   . Lack of Transportation (Medical): Not on file  . Lack of Transportation (Non-Medical): Not on file  Physical Activity:   . Days of Exercise per Week: Not on file  . Minutes of Exercise per Session: Not on file  Stress:   . Feeling of Stress : Not on file  Social Connections:   . Frequency of Communication with Friends and Family: Not on file  . Frequency of Social Gatherings with Friends and Family: Not on file  . Attends Religious Services: Not on file  . Active Member of Clubs or Organizations: Not on file  . Attends Archivist Meetings: Not on file  . Marital Status: Not on file     Family History: The patient's family history  includes Cancer (age of onset: 14) in her father; Hyperlipidemia in her father; Stroke in her father. There is no history of Breast cancer.  ROS:   Please see the history of present illness.     All other systems reviewed and are negative.  EKGs/Labs/Other Studies Reviewed:    The following studies were reviewed today:   EKG:  EKG is  ordered today.  The ekg ordered today demonstrates sinus bradycardia, heart rate 55  Recent Labs: No results found for requested labs within last 8760 hours.  Recent Lipid Panel    Component Value Date/Time   CHOL 267 (H) 03/26/2018 1527   TRIG 66.0 03/26/2018 1527   HDL 108.40 03/26/2018 1527   CHOLHDL 2 03/26/2018 1527   VLDL 13.2 03/26/2018 1527   LDLCALC 146 (H) 03/26/2018 1527   LDLDIRECT 116.0 06/03/2015 1455    Physical Exam:    VS:  BP 120/70 (BP Location: Right Arm, Patient Position: Sitting, Cuff Size: Normal)   Pulse (!) 55   Ht 5' 4.5" (1.638 m)   Wt 107 lb (48.5 kg)   SpO2 98%   BMI 18.08 kg/m     Wt Readings from Last 3 Encounters:  05/22/19 107 lb (48.5 kg)  05/08/19 106 lb 3.2 oz (48.2 kg)  10/12/18 107 lb 5.8 oz (48.7 kg)     GEN:  Well nourished, well developed in no acute distress HEENT: Normal NECK: No JVD; No carotid bruits LYMPHATICS: No lymphadenopathy CARDIAC: Bradycardic, regular, no murmurs, rubs, gallops RESPIRATORY:  Clear to auscultation without rales, wheezing or rhonchi  ABDOMEN: Soft, non-tender, non-distended MUSCULOSKELETAL:  No edema; No deformity  SKIN: Warm and dry NEUROLOGIC:  Alert and oriented x 3 PSYCHIATRIC:  Normal affect   ASSESSMENT:    1. Atypical chest pain    PLAN:    In order of problems listed above:  1. Patient with very atypical chest and neck discomfort not related with exertion occurring infrequently.  She has no cardiac risk factors such as smoking, hypertension or family history.  Blood pressure is normal, physical exam benign.  Her ASCVD 10-year risk score is 2.7.  No  indication for statin from last lipid panel measured 1 year ago.  I do not believe cardiac testing is indicated at this point.  However, if symptoms become more frequent and or bothersome, we will consider stress testing.  Plan discussed with patient.  Follow-up as needed  This note was generated in part or whole with voice recognition software. Voice recognition is usually quite accurate but there are transcription errors that can and very often do occur. I apologize for any typographical errors that were  not detected and corrected.  Medication Adjustments/Labs and Tests Ordered: Current medicines are reviewed at length with the patient today.  Concerns regarding medicines are outlined above.  Orders Placed This Encounter  Procedures  . EKG 12-Lead   No orders of the defined types were placed in this encounter.   Patient Instructions  Medication Instructions:  Your physician recommends that you continue on your current medications as directed. Please refer to the Current Medication list given to you today.  *If you need a refill on your cardiac medications before your next appointment, please call your pharmacy*  Lab Work: None ordered If you have labs (blood work) drawn today and your tests are completely normal, you will receive your results only by: Marland Kitchen MyChart Message (if you have MyChart) OR . A paper copy in the mail If you have any lab test that is abnormal or we need to change your treatment, we will call you to review the results.  Testing/Procedures: None ordered  Follow-Up: At [facility], you and your health needs are our priority.  As part of our continuing mission to provide you with exceptional heart care, we have created designated Provider Care Teams.  These Care Teams include your primary Cardiologist (physician) and Advanced Practice Providers (APPs -  Physician Assistants and Nurse Practitioners) who all work together to provide you with the care you need, when  you need it.  Your next appointment:   As needed   The format for your next appointment:   In Person  Provider:    You may see Kate Sable, MD or one of the following Advanced Practice Providers on your designated Care Team:    Murray Hodgkins, NP  Christell Faith, PA-C  Marrianne Mood, PA-C   Other Instructions N/A     Signed, Kate Sable, MD  05/22/2019 12:14 PM    [place]

## 2019-06-02 DIAGNOSIS — Z1283 Encounter for screening for malignant neoplasm of skin: Secondary | ICD-10-CM | POA: Diagnosis not present

## 2019-06-02 DIAGNOSIS — L820 Inflamed seborrheic keratosis: Secondary | ICD-10-CM | POA: Diagnosis not present

## 2019-06-02 DIAGNOSIS — D692 Other nonthrombocytopenic purpura: Secondary | ICD-10-CM | POA: Diagnosis not present

## 2019-06-02 DIAGNOSIS — L814 Other melanin hyperpigmentation: Secondary | ICD-10-CM | POA: Diagnosis not present

## 2019-06-02 DIAGNOSIS — L400 Psoriasis vulgaris: Secondary | ICD-10-CM | POA: Diagnosis not present

## 2019-06-02 DIAGNOSIS — L570 Actinic keratosis: Secondary | ICD-10-CM | POA: Diagnosis not present

## 2019-07-16 ENCOUNTER — Other Ambulatory Visit: Payer: Self-pay | Admitting: General Surgery

## 2019-07-16 DIAGNOSIS — Z1211 Encounter for screening for malignant neoplasm of colon: Secondary | ICD-10-CM | POA: Diagnosis not present

## 2019-07-20 ENCOUNTER — Other Ambulatory Visit: Payer: Self-pay

## 2019-07-20 ENCOUNTER — Other Ambulatory Visit
Admission: RE | Admit: 2019-07-20 | Discharge: 2019-07-20 | Disposition: A | Discharge status: Home / Self Care | Payer: BC Managed Care – PPO | Source: Ambulatory Visit | Attending: General Surgery | Admitting: General Surgery

## 2019-07-20 DIAGNOSIS — Z20822 Contact with and (suspected) exposure to covid-19: Secondary | ICD-10-CM | POA: Insufficient documentation

## 2019-07-20 DIAGNOSIS — Z01812 Encounter for preprocedural laboratory examination: Secondary | ICD-10-CM | POA: Insufficient documentation

## 2019-07-20 LAB — SARS CORONAVIRUS 2 (TAT 6-24 HRS): SARS Coronavirus 2: NEGATIVE

## 2019-07-21 ENCOUNTER — Encounter: Payer: Self-pay | Admitting: General Surgery

## 2019-07-22 ENCOUNTER — Ambulatory Visit: Payer: BC Managed Care – PPO | Admitting: Anesthesiology

## 2019-07-22 ENCOUNTER — Encounter: Payer: Self-pay | Admitting: General Surgery

## 2019-07-22 ENCOUNTER — Other Ambulatory Visit: Payer: Self-pay

## 2019-07-22 ENCOUNTER — Ambulatory Visit
Admission: RE | Admit: 2019-07-22 | Discharge: 2019-07-22 | Disposition: A | Discharge status: Home / Self Care | Payer: BC Managed Care – PPO | Attending: General Surgery | Admitting: General Surgery

## 2019-07-22 ENCOUNTER — Encounter
Admission: RE | Disposition: A | Discharge status: Home / Self Care | Payer: Self-pay | Source: Home / Self Care | Attending: General Surgery

## 2019-07-22 DIAGNOSIS — Z1211 Encounter for screening for malignant neoplasm of colon: Secondary | ICD-10-CM | POA: Insufficient documentation

## 2019-07-22 DIAGNOSIS — G709 Myoneural disorder, unspecified: Secondary | ICD-10-CM | POA: Diagnosis not present

## 2019-07-22 HISTORY — PX: COLONOSCOPY WITH PROPOFOL: SHX5780

## 2019-07-22 SURGERY — COLONOSCOPY WITH PROPOFOL
Anesthesia: General

## 2019-07-22 MED ORDER — PROPOFOL 500 MG/50ML IV EMUL
INTRAVENOUS | Status: AC
  Filled 2019-07-22: qty 50

## 2019-07-22 MED ORDER — PROPOFOL 500 MG/50ML IV EMUL
INTRAVENOUS | Status: DC | PRN
  Administered 2019-07-22: 125 ug/kg/min via INTRAVENOUS

## 2019-07-22 MED ORDER — PROPOFOL 10 MG/ML IV BOLUS
INTRAVENOUS | Status: DC | PRN
  Administered 2019-07-22: 30 mg via INTRAVENOUS
  Administered 2019-07-22: 10 mg via INTRAVENOUS
  Administered 2019-07-22: 40 mg via INTRAVENOUS

## 2019-07-22 MED ORDER — SODIUM CHLORIDE 0.9 % IV SOLN: INTRAVENOUS | Status: DC

## 2019-07-22 MED ORDER — PROPOFOL 10 MG/ML IV BOLUS
INTRAVENOUS | Status: AC
  Filled 2019-07-22: qty 20

## 2019-07-22 MED ORDER — LIDOCAINE HCL (CARDIAC) PF 100 MG/5ML IV SOSY
PREFILLED_SYRINGE | INTRAVENOUS | Status: DC | PRN
  Administered 2019-07-22: 60 mg via INTRAVENOUS

## 2019-07-22 MED ORDER — PHENYLEPHRINE HCL (PRESSORS) 10 MG/ML IV SOLN
INTRAVENOUS | Status: DC | PRN
  Administered 2019-07-22: 100 ug via INTRAVENOUS

## 2019-07-22 NOTE — Transfer of Care (Signed)
Immediate Anesthesia Transfer of Care Note  Patient: Katrina Fuentes  Procedure(s) Performed: COLONOSCOPY WITH PROPOFOL (N/A )  Patient Location: PACU  Anesthesia Type:General  Level of Consciousness: awake  Airway & Oxygen Therapy: Patient Spontanous Breathing  Post-op Assessment: Report given to RN  Post vital signs: Reviewed and stable  Last Vitals:  Vitals Value Taken Time  BP 91/62 07/22/19 0825  Temp    Pulse 51 07/22/19 0826  Resp 12 07/22/19 0826  SpO2 100 % 07/22/19 0826  Vitals shown include unvalidated device data.  Last Pain:  Vitals:   07/22/19 0725  TempSrc: Temporal  PainSc: 0-No pain         Complications: No apparent anesthesia complications

## 2019-07-22 NOTE — H&P (Signed)
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  Tolerated prep with some nausea, no vomiting.   Medications Prior to Admission  Medication Sig Dispense Refill Last Dose  . cholecalciferol (VITAMIN D) 1000 UNITS tablet Take 1,000 Units by mouth daily.   Past Month at Unknown time  . clobetasol (TEMOVATE) 0.05 % external solution USE 1-2 APPLICATIONS APPLY ON THE SKIN DAILY. APPLY TO AFFECTED AREA OF SCALP AS NEEDED  11   . conjugated estrogens (PREMARIN) vaginal cream Place 1 Applicatorful vaginally daily. (Patient not taking: Reported on 07/22/2019) 42.5 g 12 Not Taking at Unknown time  . diclofenac Sodium (VOLTAREN) 1 % GEL Apply 2 g topically 4 (four) times daily. 100 g 2   . meloxicam (MOBIC) 15 MG tablet Take 1 tablet (15 mg total) by mouth daily. (Patient not taking: Reported on 07/22/2019) 90 tablet 1 Not Taking at Unknown time  . methocarbamol (ROBAXIN) 500 MG tablet as needed.   Not Taking at Unknown time  . Naproxen Sodium (ALEVE PO) Take by mouth as needed.     . raloxifene (EVISTA) 60 MG tablet Take 1 tablet (60 mg total) by mouth daily. (Patient not taking: Reported on 07/22/2019) 30 tablet 5 Not Taking at Unknown time   No Known Allergies Past Medical History:  Diagnosis Date  . Degenerative disk disease 2009   cervical  c4-5, c5-6 left sided bulge  . Neuromuscular disorder ([place])   . Neuromuscular disorder [facility])    Past Surgical History:  Procedure Laterality Date  . biopsy of breast    . BREAST BIOPSY Left 2012   by [place] History   Socioeconomic History  . Marital status: Married    Spouse name: Not on file  . Number of children: Not on file  . Years of education: Not on file  . Highest education level: Not on file  Occupational History  . Not on file  Tobacco Use  . Smoking status: Never Smoker  . Smokeless tobacco: Never Used  Substance and Sexual Activity  . Alcohol use: Yes    Alcohol/week:  7.0 standard drinks    Types: 7 Glasses of wine per week    Comment: wine  . Drug use: No  . Sexual activity: Not on file  Other Topics Concern  . Not on file  Social History Narrative  . Not on file   Social Determinants of Health   Financial Resource Strain:   . Difficulty of Paying Living Expenses:   Food Insecurity:   . Worried About Charity fundraiser in the Last Year:   . Arboriculturist in the Last Year:   Transportation Needs:   . Film/video editor (Medical):   Marland Kitchen Lack of Transportation (Non-Medical):   Physical Activity:   . Days of Exercise per Week:   . Minutes of Exercise per Session:   Stress:   . Feeling of Stress :   Social Connections:   . Frequency of Communication with Friends and Family:   . Frequency of Social Gatherings with Friends and Family:   . Attends Religious Services:   . Active Member of Clubs or Organizations:   . Attends Archivist Meetings:   Marland Kitchen Marital Status:   Intimate Partner Violence:   . Fear of Current or Ex-Partner:   . Emotionally Abused:   Marland Kitchen Physically Abused:   . Sexually Abused:    Social History  Social History Narrative  . Not on file     ROS: Negative.     PE: HEENT: Negative. Lungs: Clear. Cardio: RR.  Assessment/Plan:  Proceed with planned endoscopy.   Forest Gleason [facility] 07/22/2019

## 2019-07-22 NOTE — Op Note (Signed)
[facility] Gastroenterology Patient Name: Katrina Fuentes Procedure Date: 07/22/2019 7:54 AM MRN: [id number] Account #: [id number] Date of Birth: 1956/01/24 Admit Type: Outpatient Age: 64 Room: [facility] ENDO ROOM 1 Gender: Female Note Status: Finalized Procedure:             Colonoscopy Indications:           Screening for colorectal malignant neoplasm Providers:             Robert Bellow, MD Medicines:             Monitored Anesthesia Care Complications:         No immediate complications. Procedure:             Pre-Anesthesia Assessment:                        - Prior to the procedure, a History and Physical was                         performed, and patient medications, allergies and                         sensitivities were reviewed. The patient's tolerance                         of previous anesthesia was reviewed.                        - The risks and benefits of the procedure and the                         sedation options and risks were discussed with the                         patient. All questions were answered and informed                         consent was obtained.                        After obtaining informed consent, the colonoscope was                         passed under direct vision. Throughout the procedure,                         the patient's blood pressure, pulse, and oxygen                         saturations were monitored continuously. The was                         introduced through the anus and advanced to the the                         cecum, identified by appendiceal orifice and ileocecal                         valve. The colonoscopy was somewhat difficult due to  significant looping. Successful completion of the                         procedure was aided by using manual pressure. The                         patient tolerated the procedure well. The quality of                         the bowel  preparation was excellent. Findings:      The entire examined colon appeared normal on direct and retroflexion       views. Impression:            - The entire examined colon is normal on direct and                         retroflexion views.                        - No specimens collected. Recommendation:        - Repeat colonoscopy in 10 years for screening                         purposes. Procedure Code(s):     --- Professional ---                        [phone], Colonoscopy, flexible; diagnostic, including                         collection of specimen(s) by brushing or washing, when                         performed (separate procedure) Diagnosis Code(s):     --- Professional ---                        Z12.11, Encounter for screening for malignant neoplasm                         of colon CPT copyright 2019 [employer]. All rights reserved. The codes documented in this report are preliminary and upon coder review may  be revised to meet current compliance requirements. Robert Bellow, MD 07/22/2019 8:20:13 AM This report has been signed electronically. Number of Addenda: 0 Note Initiated On: 07/22/2019 7:54 AM Scope Withdrawal Time: 0 hours 11 minutes 38 seconds  Total Procedure Duration: 0 hours 22 minutes 11 seconds  Estimated Blood Loss:  Estimated blood loss: none.      [facility]

## 2019-07-22 NOTE — Anesthesia Postprocedure Evaluation (Signed)
Anesthesia Post Note  Patient: Katrina Fuentes  Procedure(s) Performed: COLONOSCOPY WITH PROPOFOL (N/A )  Patient location during evaluation: PACU Anesthesia Type: General Level of consciousness: awake and alert Pain management: pain level controlled Vital Signs Assessment: post-procedure vital signs reviewed and stable Respiratory status: spontaneous breathing, nonlabored ventilation and respiratory function stable Cardiovascular status: blood pressure returned to baseline and stable Postop Assessment: no apparent nausea or vomiting Anesthetic complications: no     Last Vitals:  Vitals:   07/22/19 0835 07/22/19 0845  BP: 106/72 113/74  Pulse: (!) 103 (!) 28  Resp: 18 16  Temp:    SpO2: 95% 94%    Last Pain:  Vitals:   07/22/19 0845  TempSrc:   PainSc: 0-No pain                 Tera Mater

## 2019-07-22 NOTE — Anesthesia Preprocedure Evaluation (Addendum)
Anesthesia Evaluation  Patient identified by MRN, date of birth, ID band Patient awake    Reviewed: Allergy & Precautions, H&P , NPO status , Patient's Chart, lab work & pertinent test results  Airway Mallampati: III  TM Distance: <3 FB    Comment: Somewhat small mouth opening [facility]  (+) Teeth Intact   Pulmonary neg pulmonary ROS, neg shortness of breath, neg sleep apnea, neg COPD, neg recent URI, Not current smoker,    breath sounds clear to auscultation       Cardiovascular (-) angina(-) Past MI negative cardio ROS  (-) dysrhythmias  Rhythm:regular Rate:Normal     Neuro/Psych negative neurological ROS  negative psych ROS   GI/Hepatic negative GI ROS, Neg liver ROS,   Endo/Other  negative endocrine ROS  Renal/GU negative Renal ROS  negative genitourinary   Musculoskeletal   Abdominal   Peds  Hematology negative hematology ROS (+)   Anesthesia Other Findings Past Medical History: 2009: Degenerative disk disease     Comment:  cervical  c4-5, c5-6 left sided bulge No date: Neuromuscular disorder (HCC) No date: Neuromuscular disorder [facility])  Past Surgical History: No date: biopsy of breast 2012: BREAST BIOPSY; Left     Comment:  by Jamal Collin     Reproductive/Obstetrics negative OB ROS                            Anesthesia Physical Anesthesia Plan  ASA: II  Anesthesia Plan: General   Post-op Pain Management:    Induction:   PONV Risk Score and Plan: Propofol infusion and TIVA  Airway Management Planned: Natural Airway and Nasal Cannula  Additional Equipment:   Intra-op Plan:   Post-operative Plan:   Informed Consent: I have reviewed the patients History and Physical, chart, labs and discussed the procedure including the risks, benefits and alternatives for the proposed anesthesia with the patient or authorized representative who has indicated his/her  understanding and acceptance.     Dental Advisory Given  Plan Discussed with: Anesthesiologist, CRNA and Surgeon  Anesthesia Plan Comments:         Anesthesia Quick Evaluation

## 2019-07-23 ENCOUNTER — Encounter: Payer: Self-pay | Admitting: *Deleted

## 2019-09-09 ENCOUNTER — Other Ambulatory Visit: Payer: Self-pay | Admitting: Internal Medicine

## 2019-09-22 ENCOUNTER — Other Ambulatory Visit: Payer: Self-pay | Admitting: Internal Medicine

## 2019-09-22 DIAGNOSIS — Z1231 Encounter for screening mammogram for malignant neoplasm of breast: Secondary | ICD-10-CM

## 2019-11-05 ENCOUNTER — Ambulatory Visit
Admission: RE | Admit: 2019-11-05 | Discharge: 2019-11-05 | Disposition: A | Discharge status: Home / Self Care | Payer: BC Managed Care – PPO | Source: Ambulatory Visit | Attending: Internal Medicine | Admitting: Internal Medicine

## 2019-11-05 DIAGNOSIS — Z1231 Encounter for screening mammogram for malignant neoplasm of breast: Secondary | ICD-10-CM | POA: Insufficient documentation

## 2020-01-27 ENCOUNTER — Ambulatory Visit: Payer: BC Managed Care – PPO | Admitting: Dermatology

## 2020-01-27 ENCOUNTER — Other Ambulatory Visit: Payer: Self-pay

## 2020-01-27 DIAGNOSIS — L578 Other skin changes due to chronic exposure to nonionizing radiation: Secondary | ICD-10-CM

## 2020-01-27 DIAGNOSIS — L821 Other seborrheic keratosis: Secondary | ICD-10-CM

## 2020-01-27 DIAGNOSIS — L820 Inflamed seborrheic keratosis: Secondary | ICD-10-CM

## 2020-01-27 NOTE — Progress Notes (Signed)
   Follow-Up Visit   Subjective  Katrina Fuentes is a 64 y.o. female who presents for the following: spots (hands, chest, L cheek - never heals on hands) and growth (scalp, has been treated with LN2 in the past, sore).   The following portions of the chart were reviewed this encounter and updated as appropriate:      Review of Systems:  No other skin or systemic complaints except as noted in HPI or Assessment and Plan.  Objective  Well appearing patient in no apparent distress; mood and affect are within normal limits.  A focused examination was performed including scalp, chest, hands. Relevant physical exam findings are noted in the Assessment and Plan.  Objective  Left Parietal Scalp x 1, R hand dorsum x 2, L pretibia x 1 (4): Small residual scaly flesh papule- scalp  Left Malar Cheek: Light pink macule   Assessment & Plan   Seborrheic Keratoses - Stuck-on, waxy, tan macule, right clavicle  - Discussed benign etiology and prognosis. - Observe - Call for any changes  Actinic Damage - diffuse scaly erythematous macules with underlying dyspigmentation - Recommend daily broad spectrum sunscreen SPF 30+ to sun-exposed areas, reapply every 2 hours as needed.  - Call for new or changing lesions.   Inflamed seborrheic keratosis (5) Left Parietal Scalp x 1, R hand dorsum x 2, L pretibia x 1 (4); Left Malar Cheek  ISK, resolved on the left malar cheek - no treatment today, observe  Cryotherapy to residual lesion scalp, and new lesions extremities  Destruction of lesion - Left Parietal Scalp x 1, R hand dorsum x 2, L pretibia x 1  Destruction method: cryotherapy   Informed consent: discussed and consent obtained   Lesion destroyed using liquid nitrogen: Yes   Region frozen until ice ball extended beyond lesion: Yes   Outcome: patient tolerated procedure well with no complications   Post-procedure details: wound care instructions given    Return as scheduled for  TBSE.  IJamesetta Orleans, CMA, am acting as scribe for Brendolyn Patty, MD .  Documentation: I have reviewed the above documentation for accuracy and completeness, and I agree with the above.  Brendolyn Patty MD

## 2020-01-27 NOTE — Patient Instructions (Signed)
Cryotherapy Aftercare  . Wash gently with soap and water everyday.   . Apply Vaseline and Band-Aid daily until healed.  

## 2020-03-04 ENCOUNTER — Other Ambulatory Visit: Payer: Self-pay | Admitting: Internal Medicine

## 2020-05-26 ENCOUNTER — Encounter: Payer: BC Managed Care – PPO | Admitting: Internal Medicine

## 2020-06-06 ENCOUNTER — Encounter: Payer: BC Managed Care – PPO | Admitting: Dermatology

## 2020-07-06 ENCOUNTER — Ambulatory Visit (INDEPENDENT_AMBULATORY_CARE_PROVIDER_SITE_OTHER): Payer: BC Managed Care – PPO | Admitting: Internal Medicine

## 2020-07-06 ENCOUNTER — Encounter: Payer: Self-pay | Admitting: Internal Medicine

## 2020-07-06 ENCOUNTER — Other Ambulatory Visit: Payer: Self-pay

## 2020-07-06 VITALS — BP 96/62 | HR 47 | Temp 97.4°F | Resp 14 | Ht 64.25 in | Wt 104.8 lb

## 2020-07-06 DIAGNOSIS — Z0000 Encounter for general adult medical examination without abnormal findings: Secondary | ICD-10-CM | POA: Diagnosis not present

## 2020-07-06 DIAGNOSIS — R634 Abnormal weight loss: Secondary | ICD-10-CM | POA: Diagnosis not present

## 2020-07-06 DIAGNOSIS — E559 Vitamin D deficiency, unspecified: Secondary | ICD-10-CM

## 2020-07-06 DIAGNOSIS — Z9229 Personal history of other drug therapy: Secondary | ICD-10-CM

## 2020-07-06 DIAGNOSIS — R636 Underweight: Secondary | ICD-10-CM

## 2020-07-06 DIAGNOSIS — Z1231 Encounter for screening mammogram for malignant neoplasm of breast: Secondary | ICD-10-CM

## 2020-07-06 DIAGNOSIS — E7800 Pure hypercholesterolemia, unspecified: Secondary | ICD-10-CM | POA: Diagnosis not present

## 2020-07-06 DIAGNOSIS — Z23 Encounter for immunization: Secondary | ICD-10-CM

## 2020-07-06 DIAGNOSIS — M810 Age-related osteoporosis without current pathological fracture: Secondary | ICD-10-CM

## 2020-07-06 LAB — VITAMIN D 25 HYDROXY (VIT D DEFICIENCY, FRACTURES): VITD: 43.43 ng/mL (ref 30.00–100.00)

## 2020-07-06 LAB — COMPREHENSIVE METABOLIC PANEL
ALT: 16 U/L (ref 0–35)
AST: 26 U/L (ref 0–37)
Albumin: 4.2 g/dL (ref 3.5–5.2)
Alkaline Phosphatase: 66 U/L (ref 39–117)
BUN: 16 mg/dL (ref 6–23)
CO2: 30 mEq/L (ref 19–32)
Calcium: 9.6 mg/dL (ref 8.4–10.5)
Chloride: 105 mEq/L (ref 96–112)
Creatinine, Ser: 0.67 mg/dL (ref 0.40–1.20)
GFR: 93.33 mL/min (ref >60.00)
Glucose, Bld: 90 mg/dL (ref 70–99)
Potassium: 4.4 mEq/L (ref 3.5–5.1)
Sodium: 142 mEq/L (ref 135–145)
Total Bilirubin: 0.7 mg/dL (ref 0.2–1.2)
Total Protein: 6.3 g/dL (ref 6.0–8.3)

## 2020-07-06 LAB — LIPID PANEL
Cholesterol: 227 mg/dL — ABNORMAL HIGH (ref 0–200)
HDL: 107.9 mg/dL (ref >39.00)
LDL Cholesterol: 109 mg/dL — ABNORMAL HIGH (ref 0–99)
NonHDL: 119.34
Total CHOL/HDL Ratio: 2
Triglycerides: 54 mg/dL (ref 0.0–149.0)
VLDL: 10.8 mg/dL (ref 0.0–40.0)

## 2020-07-06 LAB — CBC WITH DIFFERENTIAL/PLATELET
Basophils Absolute: 0 10*3/uL (ref 0.0–0.1)
Basophils Relative: 0.7 % (ref 0.0–3.0)
Eosinophils Absolute: 0.1 10*3/uL (ref 0.0–0.7)
Eosinophils Relative: 2.5 % (ref 0.0–5.0)
HCT: 40.5 % (ref 36.0–46.0)
Hemoglobin: 13.4 g/dL (ref 12.0–15.0)
Lymphocytes Relative: 34.4 % (ref 12.0–46.0)
Lymphs Abs: 1.5 10*3/uL (ref 0.7–4.0)
MCHC: 33.2 g/dL (ref 30.0–36.0)
MCV: 93.7 fl (ref 78.0–100.0)
Monocytes Absolute: 0.4 10*3/uL (ref 0.1–1.0)
Monocytes Relative: 9.9 % (ref 3.0–12.0)
Neutro Abs: 2.3 10*3/uL (ref 1.4–7.7)
Neutrophils Relative %: 52.5 % (ref 43.0–77.0)
Platelets: 179 10*3/uL (ref 150.0–400.0)
RBC: 4.32 Mil/uL (ref 3.87–5.11)
RDW: 14 % (ref 11.5–15.5)
WBC: 4.3 10*3/uL (ref 4.0–10.5)

## 2020-07-06 LAB — TSH: TSH: 3.12 u[IU]/mL (ref 0.35–4.50)

## 2020-07-06 NOTE — Progress Notes (Signed)
[id number]

## 2020-07-06 NOTE — Patient Instructions (Addendum)
You are underweight.  Your annual mammogram has been ordered.  You are encouraged (required) to call to make your appointment at [facility]  You received the TDaP vaccine today  Return for Shingles vaccine series  At your leisure.    Health Maintenance for Postmenopausal Women Menopause is a normal process in which your ability to get pregnant comes to an end. This process happens slowly over many months or years, usually between the ages of 34 and 47. Menopause is complete when you have missed your menstrual periods for 12 months. It is important to talk with your health care provider about some of the most common conditions that affect women after menopause (postmenopausal women). These include heart disease, cancer, and bone loss (osteoporosis). Adopting a healthy lifestyle and getting preventive care can help to promote your health and wellness. The actions you take can also lower your chances of developing some of these common conditions. What should I know about menopause? During menopause, you may get a number of symptoms, such as:  Hot flashes. These can be moderate or severe.  Night sweats.  Decrease in sex drive.  Mood swings.  Headaches.  Tiredness.  Irritability.  Memory problems.  Insomnia. Choosing to treat or not to treat these symptoms is a decision that you make with your health care provider. Do I need hormone replacement therapy?  Hormone replacement therapy is effective in treating symptoms that are caused by menopause, such as hot flashes and night sweats.  Hormone replacement carries certain risks, especially as you become older. If you are thinking about using estrogen or estrogen with progestin, discuss the benefits and risks with your health care provider. What is my risk for heart disease and stroke? The risk of heart disease, heart attack, and stroke increases as you age. One of the causes may be a change in the body's hormones during  menopause. This can affect how your body uses dietary fats, triglycerides, and cholesterol. Heart attack and stroke are medical emergencies. There are many things that you can do to help prevent heart disease and stroke. Watch your blood pressure  High blood pressure causes heart disease and increases the risk of stroke. This is more likely to develop in people who have high blood pressure readings, are of African descent, or are overweight.  Have your blood pressure checked: ? Every 3-5 years if you are 42-74 years of age. ? Every year if you are 39 years old or older. Eat a healthy diet  Eat a diet that includes plenty of vegetables, fruits, low-fat dairy products, and lean protein.  Do not eat a lot of foods that are high in solid fats, added sugars, or sodium.   Get regular exercise Get regular exercise. This is one of the most important things you can do for your health. Most adults should:  Try to exercise for at least 150 minutes each week. The exercise should increase your heart rate and make you sweat (moderate-intensity exercise).  Try to do strengthening exercises at least twice each week. Do these in addition to the moderate-intensity exercise.  Spend less time sitting. Even light physical activity can be beneficial. Other tips  Work with your health care provider to achieve or maintain a healthy weight.  Do not use any products that contain nicotine or tobacco, such as cigarettes, e-cigarettes, and chewing tobacco. If you need help quitting, ask your health care provider.  Know your numbers. Ask your health care provider to check your  cholesterol and your blood sugar (glucose). Continue to have your blood tested as directed by your health care provider. Do I need screening for cancer? Depending on your health history and family history, you may need to have cancer screening at different stages of your life. This may include screening for:  Breast cancer.  Cervical  cancer.  Lung cancer.  Colorectal cancer. What is my risk for osteoporosis? After menopause, you may be at increased risk for osteoporosis. Osteoporosis is a condition in which bone destruction happens more quickly than new bone creation. To help prevent osteoporosis or the bone fractures that can happen because of osteoporosis, you may take the following actions:  If you are 52-63 years old, get at least 1,000 mg of calcium and at least 600 mg of vitamin D per day.  If you are older than age 90 but younger than age 3, get at least 1,200 mg of calcium and at least 600 mg of vitamin D per day.  If you are older than age 77, get at least 1,200 mg of calcium and at least 800 mg of vitamin D per day. Smoking and drinking excessive alcohol increase the risk of osteoporosis. Eat foods that are rich in calcium and vitamin D, and do weight-bearing exercises several times each week as directed by your health care provider. How does menopause affect my mental health? Depression may occur at any age, but it is more common as you become older. Common symptoms of depression include:  Low or sad mood.  Changes in sleep patterns.  Changes in appetite or eating patterns.  Feeling an overall lack of motivation or enjoyment of activities that you previously enjoyed.  Frequent crying spells. Talk with your health care provider if you think that you are experiencing depression. General instructions See your health care provider for regular wellness exams and vaccines. This may include:  Scheduling regular health, dental, and eye exams.  Getting and maintaining your vaccines. These include: ? Influenza vaccine. Get this vaccine each year before the flu season begins. ? Pneumonia vaccine. ? Shingles vaccine. ? Tetanus, diphtheria, and pertussis (Tdap) booster vaccine. Your health care provider may also recommend other immunizations. Tell your health care provider if you have ever been abused or do  not feel safe at home. Summary  Menopause is a normal process in which your ability to get pregnant comes to an end.  This condition causes hot flashes, night sweats, decreased interest in sex, mood swings, headaches, or lack of sleep.  Treatment for this condition may include hormone replacement therapy.  Take actions to keep yourself healthy, including exercising regularly, eating a healthy diet, watching your weight, and checking your blood pressure and blood sugar levels.  Get screened for cancer and depression. Make sure that you are up to date with all your vaccines. This information is not intended to replace advice given to you by your health care provider. Make sure you discuss any questions you have with your health care provider. Document Revised: 04/02/2018 Document Reviewed: 04/02/2018 Elsevier Patient Education  2021 [employer].

## 2020-07-09 NOTE — Assessment & Plan Note (Signed)

## 2020-07-09 NOTE — Assessment & Plan Note (Signed)
Secondary to [place] and underweight status.  REpeat DEXA ordered

## 2020-07-09 NOTE — Assessment & Plan Note (Signed)
Secondary to elevated HDL.  No workup or treatment needed.  Lab Results  Component Value Date   CHOL 227 (H) 07/06/2020   HDL 107.90 07/06/2020   LDLCALC 109 (H) 07/06/2020   LDLDIRECT 116.0 06/03/2015   TRIG 54.0 07/06/2020   CHOLHDL 2 07/06/2020

## 2020-07-09 NOTE — Assessment & Plan Note (Signed)
Weight addressed with dietary protein intake recommendations. Screening for metabolic disorders negative   Lab Results  Component Value Date   TSH 3.12 07/06/2020   Lab Results  Component Value Date   NA 142 07/06/2020   K 4.4 07/06/2020   CL 105 07/06/2020   CO2 30 07/06/2020

## 2020-07-09 NOTE — Progress Notes (Signed)
Patient ID: Katrina Fuentes, female    DOB: 21-Apr-1956  Age: 65 y.o. MRN: [id number]  The patient is here for annual preventive examination .    The risk factors are reflected in the social history.  The roster of all physicians providing medical care to patient - is listed in the Snapshot section of the chart.  Activities of daily living:  The patient is 100% independent in all ADLs: dressing, toileting, feeding as well as independent mobility  Home safety : The patient has smoke detectors in the home. They wear seatbelts.  There are no firearms at home. There is no violence in the home.   There is no risks for hepatitis, STDs or HIV. There is no   history of blood transfusion. They have no travel history to infectious disease endemic areas of the world.  The patient has seen their dentist in the last six month. They have seen their eye doctor in the last year.  She denies hearing difficulty with regard to whispered voices and some television programs.  They have deferred audiologic testing in the last year.  They do not  have excessive sun exposure. Discussed the need for sun protection: hats, long sleeves and use of sunscreen if there is significant sun exposure.   Diet: the importance of a healthy diet is discussed. They do have a healthy diet.  The benefits of regular aerobic exercise were discussed. She runs 6 days per week for 60  minutes.   Depression screen: there are no signs or vegative symptoms of depression- irritability, change in appetite, anhedonia, sadness/tearfullness.  Cognitive assessment: the patient manages all their financial and personal affairs and is actively engaged. They could relate day,date,year and events; recalled 2/3 objects at 3 minutes; performed clock-face test normally.  The following portions of the patient's history were reviewed and updated as appropriate: allergies, current medications, past family history, past medical history,  past surgical history,  past social history  and problem list.  Visual acuity was not assessed per patient preference since she has regular follow up with her ophthalmologist. Hearing and body mass index were assessed and reviewed.   During the course of the visit the patient was educated and counseled about appropriate screening and preventive services including : fall prevention , diabetes screening, nutrition counseling, colorectal cancer screening, and recommended immunizations.    CC: The primary encounter diagnosis was Pure hypercholesterolemia. Diagnoses of Vitamin D deficiency, Weight loss, COVID-19 vaccine series completed, Encounter for screening mammogram for malignant neoplasm of breast, Osteoporosis of femur without pathological fracture, Need for Tdap vaccination, Mildly underweight adult, and Visit for preventive health examination were also pertinent to this visit.  No current issues.  History Marena has a past medical history of Degenerative disk disease (2009), Neuromuscular disorder ([place]), and Neuromuscular disorder ([place]).   She has a past surgical history that includes Breast biopsy (Left, 2012); biopsy of breast; and Colonoscopy with propofol (N/A, 07/22/2019).   Her family history includes Cancer (age of onset: 79) in her father; Hyperlipidemia in her father; Stroke in her father.She reports that she has never smoked. She has never used smokeless tobacco. She reports current alcohol use of about 7.0 standard drinks of alcohol per week. She reports that she does not use drugs.  Outpatient Medications Prior to Visit  Medication Sig Dispense Refill  . cholecalciferol (VITAMIN D) 1000 UNITS tablet Take 1,000 Units by mouth daily.    . clobetasol (TEMOVATE) 0.05 % external solution as needed.  11  .  conjugated estrogens (PREMARIN) vaginal cream Place 1 Applicatorful vaginally daily. (Patient taking differently: Place 1 Applicatorful vaginally as needed.) 42.5 g 12  . diclofenac Sodium (VOLTAREN) 1 %  GEL Apply 2 g topically 4 (four) times daily. (Patient taking differently: Apply 2 g topically 4 (four) times daily. As needed) 100 g 2  . meloxicam (MOBIC) 15 MG tablet Take 15 mg by mouth as needed for pain.    . Naproxen Sodium (ALEVE PO) Take by mouth as needed.    . raloxifene (EVISTA) 60 MG tablet TAKE 1 TABLET BY MOUTH EVERY DAY 90 tablet 1  . meloxicam (MOBIC) 15 MG tablet Take 1 tablet (15 mg total) by mouth daily. (Patient not taking: No sig reported) 90 tablet 1  . methocarbamol (ROBAXIN) 500 MG tablet as needed. (Patient not taking: Reported on 07/06/2020)     No facility-administered medications prior to visit.    Review of Systems   Patient denies headache, fevers, malaise, unintentional weight loss, skin rash, eye pain, sinus congestion and sinus pain, sore throat, dysphagia,  hemoptysis , cough, dyspnea, wheezing, chest pain, palpitations, orthopnea, edema, abdominal pain, nausea, melena, diarrhea, constipation, flank pain, dysuria, hematuria, urinary  Frequency, nocturia, numbness, tingling, seizures,  Focal weakness, Loss of consciousness,  Tremor, insomnia, depression, anxiety, and suicidal ideation.      Objective:  BP 96/62 (BP Location: Left Arm, Patient Position: Sitting, Cuff Size: Normal)   Pulse (!) 47   Temp (!) 97.4 F (36.3 C) (Oral)   Resp 14   Ht 5' 4.25" (1.632 m)   Wt 104 lb 12.8 oz (47.5 kg)   SpO2 98%   BMI 17.85 kg/m   Physical Exam  General appearance: alert, cooperative and appears stated age Head: Normocephalic, without obvious abnormality, atraumatic Eyes: conjunctivae/corneas clear. PERRL, EOM's intact. Fundi benign. Ears: normal TM's and external ear canals both ears Nose: Nares normal. Septum midline. Mucosa normal. No drainage or sinus tenderness. Throat: lips, mucosa, and tongue normal; teeth and gums normal Neck: no adenopathy, no carotid bruit, no JVD, supple, symmetrical, trachea midline and thyroid not enlarged, symmetric, no  tenderness/mass/nodules Lungs: clear to auscultation bilaterally Breasts: normal appearance, no masses or tenderness Heart: regular rate and rhythm, S1, S2 normal, no murmur, click, rub or gallop Abdomen: soft, non-tender; bowel sounds normal; no masses,  no organomegaly Extremities: extremities normal, atraumatic, no cyanosis or edema Pulses: 2+ and symmetric Skin: Skin color, texture, turgor normal. No rashes or lesions Neurologic: Alert and oriented X 3, normal strength and tone. Normal symmetric reflexes. Normal coordination and gait.    Assessment & Plan:   Problem List Items Addressed This Visit      Unprioritized   Hyperlipidemia - Primary    Secondary to elevated HDL.  No workup or treatment needed.  Lab Results  Component Value Date   CHOL 227 (H) 07/06/2020   HDL 107.90 07/06/2020   LDLCALC 109 (H) 07/06/2020   LDLDIRECT 116.0 06/03/2015   TRIG 54.0 07/06/2020   CHOLHDL 2 07/06/2020         Relevant Orders   Lipid panel (Completed)   Comprehensive metabolic panel (Completed)   Mildly underweight adult    Weight addressed with dietary protein intake recommendations. Screening for metabolic disorders negative   Lab Results  Component Value Date   TSH 3.12 07/06/2020   Lab Results  Component Value Date   NA 142 07/06/2020   K 4.4 07/06/2020   CL 105 07/06/2020   CO2 30 07/06/2020  Osteoporosis of femur without pathological fracture    Secondary to [place] and underweight status.  REpeat DEXA ordered       Relevant Orders   DG Bone Density   Visit for preventive health examination    age appropriate education and counseling updated, referrals for preventative services and immunizations addressed, dietary and smoking counseling addressed, most recent labs reviewed.  I have personally reviewed and have noted:  1) the patient's medical and social history 2) The pt's use of alcohol, tobacco, and illicit drugs 3) The patient's current medications and  supplements 4) Functional ability including ADL's, fall risk, home safety risk, hearing and visual impairment 5) Diet and physical activities 6) Evidence for depression or mood disorder 7) The patient's height, weight, and BMI have been recorded in the chart  I have made referrals, and provided counseling and education based on review of the above       Other Visit Diagnoses    Vitamin D deficiency       Relevant Orders   VITAMIN D 25 Hydroxy (Vit-D Deficiency, Fractures) (Completed)   Weight loss       Relevant Orders   TSH (Completed)   CBC with Differential/Platelet (Completed)   COVID-19 vaccine series completed       Relevant Orders   SARS-CoV-2 Semi-Quantitative Total Antibody, Spike   Encounter for screening mammogram for malignant neoplasm of breast       Relevant Orders   MM 3D SCREEN BREAST BILATERAL   Need for Tdap vaccination       Relevant Orders   Tdap vaccine greater than or equal to 7yo IM (Completed)      I have discontinued Derian S. Dangler's methocarbamol. I am also having her maintain her cholecalciferol, Naproxen Sodium (ALEVE PO), clobetasol, conjugated estrogens, diclofenac Sodium, raloxifene, and meloxicam.  No orders of the defined types were placed in this encounter.   Medications Discontinued During This Encounter  Medication Reason  . meloxicam (MOBIC) 15 MG tablet   . methocarbamol (ROBAXIN) 500 MG tablet     Follow-up: No follow-ups on file.   Crecencio Mc, MD

## 2020-07-12 ENCOUNTER — Other Ambulatory Visit: Payer: Self-pay

## 2020-07-12 ENCOUNTER — Ambulatory Visit (INDEPENDENT_AMBULATORY_CARE_PROVIDER_SITE_OTHER): Payer: BC Managed Care – PPO | Admitting: Dermatology

## 2020-07-12 DIAGNOSIS — D229 Melanocytic nevi, unspecified: Secondary | ICD-10-CM

## 2020-07-12 DIAGNOSIS — D2271 Melanocytic nevi of right lower limb, including hip: Secondary | ICD-10-CM | POA: Diagnosis not present

## 2020-07-12 DIAGNOSIS — Z1283 Encounter for screening for malignant neoplasm of skin: Secondary | ICD-10-CM | POA: Diagnosis not present

## 2020-07-12 DIAGNOSIS — L578 Other skin changes due to chronic exposure to nonionizing radiation: Secondary | ICD-10-CM

## 2020-07-12 DIAGNOSIS — D1800 Hemangioma unspecified site: Secondary | ICD-10-CM

## 2020-07-12 DIAGNOSIS — L988 Other specified disorders of the skin and subcutaneous tissue: Secondary | ICD-10-CM

## 2020-07-12 DIAGNOSIS — I781 Nevus, non-neoplastic: Secondary | ICD-10-CM

## 2020-07-12 DIAGNOSIS — L570 Actinic keratosis: Secondary | ICD-10-CM | POA: Diagnosis not present

## 2020-07-12 DIAGNOSIS — L409 Psoriasis, unspecified: Secondary | ICD-10-CM | POA: Diagnosis not present

## 2020-07-12 DIAGNOSIS — L821 Other seborrheic keratosis: Secondary | ICD-10-CM

## 2020-07-12 DIAGNOSIS — D225 Melanocytic nevi of trunk: Secondary | ICD-10-CM | POA: Diagnosis not present

## 2020-07-12 DIAGNOSIS — L853 Xerosis cutis: Secondary | ICD-10-CM

## 2020-07-12 DIAGNOSIS — L814 Other melanin hyperpigmentation: Secondary | ICD-10-CM

## 2020-07-12 NOTE — Progress Notes (Signed)
Follow-Up Visit   Subjective  Katrina Fuentes is a 65 y.o. female who presents for the following: tbse (Patient here today for tbse. She has history of sks. She has no history of skin cancer.).  Patient here for full body skin exam and skin cancer screening.  The following portions of the chart were reviewed this encounter and updated as appropriate:      Objective  Well appearing patient in no apparent distress; mood and affect are within normal limits.  A full examination was performed including scalp, head, eyes, ears, nose, lips, neck, chest, axillae, abdomen, back, buttocks, bilateral upper extremities, bilateral lower extremities, hands, feet, fingers, toes, fingernails, and toenails. All findings within normal limits unless otherwise noted below.  Objective  face and scalp: Pink scaly patch occipital scalp, right postauricular   Objective  right anterior ankle: 2 mm dark blue macule   right anterior medial ankle: Med/dark brown macule   right upper abdomen: 3.5 mm brown macule   right medial foot: 4 mm med/light brown macule   Objective  mid chest x 4,  left pretibia x 2, right lower neck x 2 (8): Erythematous thin papules/macules with gritty scale.   Objective  bilateral legs: Dry flaky skin   Objective  face: Rhytides and volume loss.   Assessment & Plan  Psoriasis face and scalp  Controlled Patient currently using clobetasol solution twice daily for scalp and body as needed for flares  Patient also uses Eucrisa as needed apply to hairline/face qd/bid  Psoriasis is a chronic non-curable, but treatable genetic/hereditary disease that may have other systemic features affecting other organ systems such as joints (Psoriatic Arthritis). It is associated with an increased risk of inflammatory bowel disease, heart disease, non-alcoholic fatty liver disease, and depression.          Nevus (4) right anterior ankle; right anterior medial ankle; right  medial foot; right upper abdomen  Benign-appearing.  Stable. Observation.  Call clinic for new or changing lesions.  Recommend daily use of broad spectrum spf 30+ sunscreen to sun-exposed areas.    Actinic keratosis (8) mid chest x 4,  left pretibia x 2, right lower neck x 2  Vs ISKs    Destruction of lesion - mid chest x 4,  left pretibia x 2, right lower neck x 2  Destruction method: cryotherapy   Informed consent: discussed and consent obtained   Lesion destroyed using liquid nitrogen: Yes   Region frozen until ice ball extended beyond lesion: Yes   Outcome: patient tolerated procedure well with no complications   Post-procedure details: wound care instructions given   Additional details:  Prior to procedure, discussed risks of blister formation, small wound, skin dyspigmentation, or rare scar following cryotherapy.     Xerosis cutis bilateral legs  Recommend mild soap and moisturizing cream 1-2 times daily.  Gentle skin care handout provided.    Elastosis of skin face  Discussed Alastin restorative face, eye cream, neck cream. Info given  Continue vitamin c serum bid, Perfect A cream qhs , and sunscreen daily  Briefly discussed fillers/botox   Lentigines - Scattered tan macules - Due to sun exposure - Benign-appering, observe - Recommend daily broad spectrum sunscreen SPF 30+ to sun-exposed areas, reapply every 2 hours as needed. - Call for any changes  Seborrheic Keratoses - Stuck-on, waxy, tan-brown papules and plaques  - Discussed benign etiology and prognosis. - Observe - Call for any changes  Melanocytic Nevi - Tan-brown and/or pink-flesh-colored symmetric  macules and papules - Benign appearing on exam today - Observation - Call clinic for new or changing moles - Recommend daily use of broad spectrum spf 30+ sunscreen to sun-exposed areas.   Hemangiomas - Red papules - Discussed benign nature - Observe - Call for any changes  Telangiectasia -  Dilated blood vessel left nasal dorsum - Benign appearing on exam - Call for changes  Actinic Damage - Chronic, secondary to cumulative UV/sun exposure - diffuse scaly erythematous macules with underlying dyspigmentation on chest  - Recommend daily broad spectrum sunscreen SPF 30+ to sun-exposed areas, reapply every 2 hours as needed.  - Call for new or changing lesions.  Skin cancer screening performed today.  Return in about 1 year (around 07/12/2021) for tbse.  I, Ruthell Rummage, CMA, am acting as scribe for Brendolyn Patty, MD.  Documentation: I have reviewed the above documentation for accuracy and completeness, and I agree with the above.  Brendolyn Patty MD

## 2020-07-12 NOTE — Patient Instructions (Addendum)
Dry Skin Care  What causes dry skin?  Dry skin is common and results from inadequate moisture in the outer skin layers. Dry skin usually results from the excessive loss of moisture from the skin surface. This occurs due to two major factors: 1. Normally the skin's oil glands deposit a layer of oil on the skin's surface. This layer of oil prevents the loss of moisture from the skin. Exposure to soaps, cleaners, solvents, and disinfectants removes this oily film, allowing water to escape. 2. Water loss from the skin increases when the humidity is low. During winter months we spend a lot of time indoors where the air is heated. Heated air has very low humidity. This also contributes to dry skin.  A tendency for dry skin may accompany such disorders as eczema. Also, as people age, the number of functioning oil glands decreases, and the tendency toward dry skin can be a sensation of skin tightness when emerging from the shower.  How do I manage dry skin?  1. Humidify your environment. This can be accomplished by using a humidifier in your bedroom at night during winter months. 2. Bathing can actually put moisture back into your skin if done right. Take the following steps while bathing to sooth dry skin:  Avoid hot water, which only dries the skin and makes itching worse. Use warm water.  Avoid washcloths or extensive rubbing or scrubbing.  Use mild soaps like unscented Dove, Oil of Olay, Cetaphil, Basis, or CeraVe.  If you take baths rather than showers, rinse off soap residue with clean water before getting out of tub.  Once out of the shower/tub, pat dry gently with a soft towel. Leave your skin damp.  While still damp, apply any medicated ointment/cream you were prescribed to the affected areas. After you apply your medicated ointment/cream, then apply your moisturizer to your whole body.This is the most important step in dry skin care. If this is omitted, your skin will continue to be  dry.  The choice of moisturizer is also very important. In general, lotion will not provider enough moisture to severely dry skin because it is water based. You should use an ointment or cream. Moisturizers should also be unscented. Good choices include Vaseline (plain petrolatum), Aquaphor, Cetaphil, CeraVe, Vanicream, DML Forte, Aveeno moisture, or Eucerin Cream.  Bath oils can be helpful, but do not replace the application of moisturizer after the bath. In addition, they make the tub slippery causing an increased risk for falls. Therefore, we do not recommend their use.  Melanoma ABCDEs  Melanoma is the most dangerous type of skin cancer, and is the leading cause of death from skin disease.  You are more likely to develop melanoma if you:  Have light-colored skin, light-colored eyes, or red or blond hair  Spend a lot of time in the sun  Tan regularly, either outdoors or in a tanning bed  Have had blistering sunburns, especially during childhood  Have a close family member who has had a melanoma  Have atypical moles or large birthmarks  Early detection of melanoma is key since treatment is typically straightforward and cure rates are extremely high if we catch it early.   The first sign of melanoma is often a change in a mole or a new dark spot.  The ABCDE system is a way of remembering the signs of melanoma.  A for asymmetry:  The two halves do not match. B for border:  The edges of the growth are irregular.  C for color:  A mixture of colors are present instead of an even brown color. D for diameter:  Melanomas are usually (but not always) greater than 15mm - the size of a pencil eraser. E for evolution:  The spot keeps changing in size, shape, and color.  Please check your skin once per month between visits. You can use a small mirror in front and a large mirror behind you to keep an eye on the back side or your body.   If you see any new or changing lesions before your next  follow-up, please call to schedule a visit.  Please continue daily skin protection including broad spectrum sunscreen SPF 30+ to sun-exposed areas, reapplying every 2 hours as needed when you're outdoors.   Staying in the shade or wearing long sleeves, sun glasses (UVA+UVB protection) and wide brim hats (4-inch brim around the entire circumference of the hat) are also recommended for sun protection.   Cryotherapy Aftercare  . Wash gently with soap and water everyday.   Marland Kitchen Apply Vaseline and Band-Aid daily until healed.   If you have any questions or concerns for your doctor, please call our main line at [phone] and press option 4 to reach your doctor's medical assistant. If no one answers, please leave a voicemail as directed and we will return your call as soon as possible. Messages left after 4 pm will be answered the following business day.   You may also send [place] a message via [place]. We typically respond to MyChart messages within 1-2 business days.  For prescription refills, please ask your pharmacy to contact our office. Our fax number is [phone].  If you have an urgent issue when the clinic is closed that cannot wait until the next business day, you can page your doctor at the number below.    Please note that while we do our best to be available for urgent issues outside of office hours, we are not available 24/7.   If you have an urgent issue and are unable to reach [place], you may choose to seek medical care at your doctor's office, retail clinic, urgent care center, or emergency room.  If you have a medical emergency, please immediately call 911 or go to the emergency department.  Pager Numbers  - Dr. Nehemiah Massed: [phone]  - Dr. Laurence Ferrari: [phone]  - Dr. Nicole Kindred: [phone]  In the event of inclement weather, please call our main line at [phone] for an update on the status of any delays or closures.  Dermatology Medication Tips: Please keep the boxes  that topical medications come in in order to help keep track of the instructions about where and how to use these. Pharmacies typically print the medication instructions only on the boxes and not directly on the medication tubes.   If your medication is too expensive, please contact our office at [phone] option 4 or send [place] a message through [place].   We are unable to tell what your co-pay for medications will be in advance as this is different depending on your insurance coverage. However, we may be able to find a substitute medication at lower cost or fill out paperwork to get insurance to cover a needed medication.   If a prior authorization is required to get your medication covered by your insurance company, please allow [place] 1-2 business days to complete this process.  Drug prices often vary depending on where the prescription is filled and some pharmacies may offer cheaper prices.  The  website www.goodrx.com contains coupons for medications through different pharmacies. The prices here do not account for what the cost may be with help from insurance (it may be cheaper with your insurance), but the website can give you the price if you did not use any insurance.  - You can print the associated coupon and take it with your prescription to the pharmacy.  - You may also stop by our office during regular business hours and pick up a GoodRx coupon card.  - If you need your prescription sent electronically to a different pharmacy, notify our office through [facility] or by phone at [phone] option 4.

## 2020-07-13 LAB — SARS-COV-2 SEMI-QUANTITATIVE TOTAL ANTIBODY, SPIKE: SARS COV2 AB, Total Spike Semi QN: >2500 U/mL — ABNORMAL HIGH (ref <0.8)

## 2020-08-09 DIAGNOSIS — H5203 Hypermetropia, bilateral: Secondary | ICD-10-CM | POA: Diagnosis not present

## 2020-09-08 ENCOUNTER — Telehealth: Payer: Self-pay | Admitting: Internal Medicine

## 2020-09-08 ENCOUNTER — Other Ambulatory Visit: Payer: Self-pay

## 2020-09-08 MED ORDER — NIRMATRELVIR/RITONAVIR (PAXLOVID)TABLET
3.0000 | ORAL_TABLET | Freq: Two times a day (BID) | ORAL | 0 refills | Status: AC
  Filled 2020-09-08: qty 30, 5d supply, fill #0

## 2020-09-08 NOTE — Telephone Encounter (Signed)
DR. Randel Books is asking if you will call in Paxlovid for her she tested positive for COVID yesterday having fever , chills , bodyaches,

## 2020-09-08 NOTE — Addendum Note (Signed)
Addended by: Crecencio Mc on: 09/08/2020 12:46 PM   Modules accepted: Orders

## 2020-09-08 NOTE — Telephone Encounter (Signed)
Pt called she tested positive for covid. She is running a fever and is having chills. Wanted to have paxlovid called in

## 2020-09-16 ENCOUNTER — Encounter: Payer: Self-pay | Admitting: Internal Medicine

## 2020-11-24 ENCOUNTER — Other Ambulatory Visit: Payer: Self-pay | Admitting: Internal Medicine

## 2020-11-29 ENCOUNTER — Ambulatory Visit
Admission: RE | Admit: 2020-11-29 | Discharge: 2020-11-29 | Disposition: A | Discharge status: Home / Self Care | Payer: BC Managed Care – PPO | Source: Ambulatory Visit | Attending: Internal Medicine | Admitting: Internal Medicine

## 2020-11-29 ENCOUNTER — Other Ambulatory Visit: Payer: Self-pay

## 2020-11-29 DIAGNOSIS — M8589 Other specified disorders of bone density and structure, multiple sites: Secondary | ICD-10-CM | POA: Diagnosis not present

## 2020-11-29 DIAGNOSIS — Z780 Asymptomatic menopausal state: Secondary | ICD-10-CM | POA: Diagnosis not present

## 2020-11-29 DIAGNOSIS — M810 Age-related osteoporosis without current pathological fracture: Secondary | ICD-10-CM | POA: Diagnosis not present

## 2020-11-29 DIAGNOSIS — Z1231 Encounter for screening mammogram for malignant neoplasm of breast: Secondary | ICD-10-CM | POA: Insufficient documentation

## 2021-05-21 ENCOUNTER — Other Ambulatory Visit: Payer: Self-pay | Admitting: Internal Medicine

## 2021-07-10 ENCOUNTER — Ambulatory Visit (INDEPENDENT_AMBULATORY_CARE_PROVIDER_SITE_OTHER): Payer: BC Managed Care – PPO | Admitting: Internal Medicine

## 2021-07-10 ENCOUNTER — Other Ambulatory Visit: Payer: Self-pay

## 2021-07-10 ENCOUNTER — Encounter: Payer: Self-pay | Admitting: Internal Medicine

## 2021-07-10 VITALS — BP 100/72 | HR 57 | Temp 97.9°F | Ht 64.25 in | Wt 105.2 lb

## 2021-07-10 DIAGNOSIS — E7800 Pure hypercholesterolemia, unspecified: Secondary | ICD-10-CM

## 2021-07-10 DIAGNOSIS — R7301 Impaired fasting glucose: Secondary | ICD-10-CM | POA: Diagnosis not present

## 2021-07-10 DIAGNOSIS — R0789 Other chest pain: Secondary | ICD-10-CM

## 2021-07-10 DIAGNOSIS — R5383 Other fatigue: Secondary | ICD-10-CM

## 2021-07-10 DIAGNOSIS — Z1231 Encounter for screening mammogram for malignant neoplasm of breast: Secondary | ICD-10-CM

## 2021-07-10 DIAGNOSIS — Z0000 Encounter for general adult medical examination without abnormal findings: Secondary | ICD-10-CM | POA: Diagnosis not present

## 2021-07-10 LAB — CBC WITH DIFFERENTIAL/PLATELET
Basophils Absolute: 0 10*3/uL (ref 0.0–0.1)
Basophils Relative: 0.8 % (ref 0.0–3.0)
Eosinophils Absolute: 0.2 10*3/uL (ref 0.0–0.7)
Eosinophils Relative: 4.3 % (ref 0.0–5.0)
HCT: 40.1 % (ref 36.0–46.0)
Hemoglobin: 13.3 g/dL (ref 12.0–15.0)
Lymphocytes Relative: 38.6 % (ref 12.0–46.0)
Lymphs Abs: 1.5 10*3/uL (ref 0.7–4.0)
MCHC: 33.1 g/dL (ref 30.0–36.0)
MCV: 93.4 fl (ref 78.0–100.0)
Monocytes Absolute: 0.4 10*3/uL (ref 0.1–1.0)
Monocytes Relative: 10.1 % (ref 3.0–12.0)
Neutro Abs: 1.8 10*3/uL (ref 1.4–7.7)
Neutrophils Relative %: 46.2 % (ref 43.0–77.0)
Platelets: 172 10*3/uL (ref 150.0–400.0)
RBC: 4.29 Mil/uL (ref 3.87–5.11)
RDW: 13.9 % (ref 11.5–15.5)
WBC: 3.9 10*3/uL — ABNORMAL LOW (ref 4.0–10.5)

## 2021-07-10 LAB — LDL CHOLESTEROL, DIRECT: Direct LDL: 97 mg/dL

## 2021-07-10 LAB — COMPREHENSIVE METABOLIC PANEL
ALT: 14 U/L (ref 0–35)
AST: 24 U/L (ref 0–37)
Albumin: 4.1 g/dL (ref 3.5–5.2)
Alkaline Phosphatase: 62 U/L (ref 39–117)
BUN: 15 mg/dL (ref 6–23)
CO2: 29 mEq/L (ref 19–32)
Calcium: 9.5 mg/dL (ref 8.4–10.5)
Chloride: 106 mEq/L (ref 96–112)
Creatinine, Ser: 0.7 mg/dL (ref 0.40–1.20)
GFR: 91.7 mL/min (ref >60.00)
Glucose, Bld: 81 mg/dL (ref 70–99)
Potassium: 4.2 mEq/L (ref 3.5–5.1)
Sodium: 141 mEq/L (ref 135–145)
Total Bilirubin: 0.5 mg/dL (ref 0.2–1.2)
Total Protein: 5.9 g/dL — ABNORMAL LOW (ref 6.0–8.3)

## 2021-07-10 LAB — LIPID PANEL
Cholesterol: 230 mg/dL — ABNORMAL HIGH (ref 0–200)
HDL: 109.8 mg/dL (ref >39.00)
LDL Cholesterol: 110 mg/dL — ABNORMAL HIGH (ref 0–99)
NonHDL: 119.92
Total CHOL/HDL Ratio: 2
Triglycerides: 48 mg/dL (ref 0.0–149.0)
VLDL: 9.6 mg/dL (ref 0.0–40.0)

## 2021-07-10 LAB — TSH: TSH: 3.05 u[IU]/mL (ref 0.35–5.50)

## 2021-07-10 MED ORDER — ZOSTER VAC RECOMB ADJUVANTED 50 MCG/0.5ML IM SUSR: 0.5000 mL | Freq: Once | INTRAMUSCULAR | 1 refills | Status: AC

## 2021-07-10 NOTE — Progress Notes (Signed)
The patient is here for annual preventive examination and management of other chronic and acute problems. ? ?This visit occurred during the SARS-CoV-2 public health emergency.  Safety protocols were in place, including screening questions prior to the visit, additional usage of staff PPE, and extensive cleaning of exam room while observing appropriate contact time as indicated for disinfecting solutions.  ? ?  ?The risk factors are reflected in the social history. ?  ?The roster of all physicians providing medical care to patient - is listed in the Snapshot section of the chart. ?  ?Activities of daily living:  The patient is 100% independent in all ADLs: dressing, toileting, feeding as well as independent mobility ?  ?Home safety : The patient has smoke detectors in the home. They wear seatbelts.  There are no unsecured firearms at home. There is no violence in the home.  ?  ?There is no risks for hepatitis, STDs or HIV. There is no   history of blood transfusion. They have no travel history to infectious disease endemic areas of the world. ?  ?The patient has seen their dentist in the last six month. They have seen their eye doctor in the last year. She denies  slight hearing difficulty with regard to whispered voices and some television programs.  She does  not  have excessive sun exposure. Discussed the need for sun protection: hats, long sleeves and use of sunscreen if there is significant sun exposure.  ?  ?Diet: the importance of a healthy diet is discussed.  She does have a  healthy diet. ?  ?The benefits of regular aerobic exercise were discussed. The patient  exercises  5 days per week  for  60 minutes.  ?  ?Depression screen: there are no signs or vegative symptoms of depression- irritability, change in appetite, anhedonia, sadness/tearfullness. ?  ?The following portions of the patient's history were reviewed and updated as appropriate: allergies, current medications, past family history, past medical  history,  past surgical history, past social history  and problem list. ?  ?Visual acuity was not assessed per patient preference since the patient has regular follow up with an  ophthalmologist. Hearing and body mass index were assessed and reviewed.  ?  ?During the course of the visit the patient was educated and counseled about appropriate screening and preventive services including : fall prevention , diabetes screening, nutrition counseling, colorectal cancer screening, and recommended immunizations.   ? ?Chief Complaint: ? ?None  ? ? ?Review of Symptoms ? ?Patient denies headache, fevers, malaise, unintentional weight loss, skin rash, eye pain, sinus congestion and sinus pain, sore throat, dysphagia,  hemoptysis , cough, dyspnea, wheezing, chest pain, palpitations, orthopnea, edema, abdominal pain, nausea, melena, diarrhea, constipation, flank pain, dysuria, hematuria, urinary  Frequency, nocturia, numbness, tingling, seizures,  Focal weakness, Loss of consciousness,  Tremor, insomnia, depression, anxiety, and suicidal ideation.   ? ?Physical Exam: ? ?BP 100/72 (BP Location: Left Arm, Patient Position: Sitting, Cuff Size: Normal)   Pulse (!) 57   Temp 97.9 ?F (36.6 ?C) (Oral)   Ht 5' 4.25" (1.632 m)   Wt 105 lb 3.2 oz (47.7 kg)   SpO2 96%   BMI 17.92 kg/m?   ? ?General appearance: alert, cooperative and appears stated age ?Head: Normocephalic, without obvious abnormality, atraumatic ?Eyes: conjunctivae/corneas clear. PERRL, EOM's intact. Fundi benign. ?Ears: normal TM's and external ear canals both ears ?Nose: Nares normal. Septum midline. Mucosa normal. No drainage or sinus tenderness. ?Throat: lips, mucosa, and tongue normal;  teeth and gums normal ?Neck: no adenopathy, no carotid bruit, no JVD, supple, symmetrical, trachea midline and thyroid not enlarged, symmetric, no tenderness/mass/nodules ?Lungs: clear to auscultation bilaterally ?Breasts: normal appearance, no masses or tenderness ?Heart: regular  rate and rhythm, S1, S2 normal, no murmur, click, rub or gallop ?Abdomen: soft, non-tender; bowel sounds normal; no masses,  no organomegaly ?Extremities: extremities normal, atraumatic, no cyanosis or edema ?Pulses: 2+ and symmetric ?Skin: Skin color, texture, turgor normal. No rashes or lesions ?Neurologic: Alert and oriented X 3, normal strength and tone. Normal symmetric reflexes. Normal coordination and gait.    ? ?Assessment and Plan: ? ?Osteoporosis of femur without pathological fracture ?Secondary to [place] and underweight status  ,  Taking Evista,  With improvement of T score from -2.6 to -2.3  By 2022 scan  ? ?Visit for preventive health examination ?age appropriate education and counseling updated, referrals for preventative services and immunizations addressed, dietary and smoking counseling addressed, most recent labs reviewed.  I have personally reviewed and have noted: ?  ?1) the patient's medical and social history ?2) The pt's use of alcohol, tobacco, and illicit drugs ?3) The patient's current medications and supplements ?4) Functional ability including ADL's, fall risk, home safety risk, hearing and visual impairment ?5) Diet and physical activities ?6) Evidence for depression or mood disorder ?7) The patient's height, weight, and BMI have been recorded in the chart  ?  ?I have made referrals, and provided counseling and education based on review of the above ? ?Atypical chest pain ?Cardiology evaluation was done in 2021.  Low likelihood,  No stress testing or ECHO was done ? ?Hyperlipidemia ?Secondary to elevated HDL.  No workup or treatment needed. ? ?Lab Results  ?Component Value Date  ? CHOL 230 (H) 07/10/2021  ? HDL 109.80 07/10/2021  ? LDLCALC 110 (H) 07/10/2021  ? LDLDIRECT 97.0 07/10/2021  ? TRIG 48.0 07/10/2021  ? CHOLHDL 2 07/10/2021  ? ? ? ? ?Updated Medication List ?Outpatient Encounter Medications as of 07/10/2021  ?Medication Sig  ? cholecalciferol (VITAMIN D) 1000 UNITS tablet Take 1,000  Units by mouth daily.  ? clobetasol (TEMOVATE) 0.05 % external solution as needed.  ? conjugated estrogens (PREMARIN) vaginal cream Place 1 Applicatorful vaginally daily. (Patient taking differently: Place 1 Applicatorful vaginally as needed.)  ? diclofenac Sodium (VOLTAREN) 1 % GEL Apply 2 g topically 4 (four) times daily. (Patient taking differently: Apply 2 g topically 4 (four) times daily. As needed)  ? meloxicam (MOBIC) 15 MG tablet Take 15 mg by mouth as needed for pain.  ? Naproxen Sodium (ALEVE PO) Take by mouth as needed.  ? raloxifene (EVISTA) 60 MG tablet TAKE 1 TABLET BY MOUTH EVERY DAY  ? Zoster Vaccine Adjuvanted [facility]) injection Inject 0.5 mLs into the muscle once for 1 dose.  ? ?No facility-administered encounter medications on file as of 07/10/2021.  ?  ?

## 2021-07-10 NOTE — Assessment & Plan Note (Signed)

## 2021-07-10 NOTE — Assessment & Plan Note (Signed)
Secondary to [place] and underweight status  ,  Taking Evista,  With improvement of T score from -2.6 to -2.3  By 2022 scan  ?

## 2021-07-10 NOTE — Assessment & Plan Note (Signed)
Secondary to elevated HDL.  No workup or treatment needed. ? ?Lab Results  ?Component Value Date  ? CHOL 230 (H) 07/10/2021  ? HDL 109.80 07/10/2021  ? LDLCALC 110 (H) 07/10/2021  ? LDLDIRECT 97.0 07/10/2021  ? TRIG 48.0 07/10/2021  ? CHOLHDL 2 07/10/2021  ? ? ?

## 2021-07-10 NOTE — Patient Instructions (Signed)
Good to see you! ? ?Send me a text a week before your [place] trip and I'll send in some meds to take with you ?

## 2021-07-10 NOTE — Assessment & Plan Note (Signed)
Cardiology evaluation was done in 2021.  Low likelihood,  No stress testing or ECHO was done ?

## 2021-07-18 ENCOUNTER — Ambulatory Visit (INDEPENDENT_AMBULATORY_CARE_PROVIDER_SITE_OTHER): Payer: BC Managed Care – PPO | Admitting: Dermatology

## 2021-07-18 ENCOUNTER — Other Ambulatory Visit: Payer: Self-pay

## 2021-07-18 DIAGNOSIS — L813 Cafe au lait spots: Secondary | ICD-10-CM

## 2021-07-18 DIAGNOSIS — L570 Actinic keratosis: Secondary | ICD-10-CM

## 2021-07-18 DIAGNOSIS — D225 Melanocytic nevi of trunk: Secondary | ICD-10-CM

## 2021-07-18 DIAGNOSIS — L578 Other skin changes due to chronic exposure to nonionizing radiation: Secondary | ICD-10-CM

## 2021-07-18 DIAGNOSIS — L821 Other seborrheic keratosis: Secondary | ICD-10-CM

## 2021-07-18 DIAGNOSIS — L814 Other melanin hyperpigmentation: Secondary | ICD-10-CM

## 2021-07-18 DIAGNOSIS — I781 Nevus, non-neoplastic: Secondary | ICD-10-CM

## 2021-07-18 DIAGNOSIS — D239 Other benign neoplasm of skin, unspecified: Secondary | ICD-10-CM

## 2021-07-18 DIAGNOSIS — D1800 Hemangioma unspecified site: Secondary | ICD-10-CM

## 2021-07-18 DIAGNOSIS — D229 Melanocytic nevi, unspecified: Secondary | ICD-10-CM

## 2021-07-18 DIAGNOSIS — D2271 Melanocytic nevi of right lower limb, including hip: Secondary | ICD-10-CM | POA: Diagnosis not present

## 2021-07-18 DIAGNOSIS — D692 Other nonthrombocytopenic purpura: Secondary | ICD-10-CM

## 2021-07-18 DIAGNOSIS — D2272 Melanocytic nevi of left lower limb, including hip: Secondary | ICD-10-CM

## 2021-07-18 DIAGNOSIS — D2372 Other benign neoplasm of skin of left lower limb, including hip: Secondary | ICD-10-CM

## 2021-07-18 DIAGNOSIS — L409 Psoriasis, unspecified: Secondary | ICD-10-CM

## 2021-07-18 DIAGNOSIS — Z1283 Encounter for screening for malignant neoplasm of skin: Secondary | ICD-10-CM | POA: Diagnosis not present

## 2021-07-18 NOTE — Patient Instructions (Addendum)
Cryotherapy Aftercare ? ?Wash gently with soap and water everyday.   ?Apply Vaseline and Band-Aid daily until healed.  ? ? ?Melanoma ABCDEs ? ?Melanoma is the most dangerous type of skin cancer, and is the leading cause of death from skin disease.  You are more likely to develop melanoma if you: ?Have light-colored skin, light-colored eyes, or red or blond hair ?Spend a lot of time in the sun ?Tan regularly, either outdoors or in a tanning bed ?Have had blistering sunburns, especially during childhood ?Have a close family member who has had a melanoma ?Have atypical moles or large birthmarks ? ?Early detection of melanoma is key since treatment is typically straightforward and cure rates are extremely high if we catch it early.  ? ?The first sign of melanoma is often a change in a mole or a new dark spot.  The ABCDE system is a way of remembering the signs of melanoma. ? ?A for asymmetry:  The two halves do not match. ?B for border:  The edges of the growth are irregular. ?C for color:  A mixture of colors are present instead of an even brown color. ?D for diameter:  Melanomas are usually (but not always) greater than 22m - the size of a pencil eraser. ?E for evolution:  The spot keeps changing in size, shape, and color. ? ?Please check your skin once per month between visits. You can use a small mirror in front and a large mirror behind you to keep an eye on the back side or your body.  ? ?If you see any new or changing lesions before your next follow-up, please call to schedule a visit. ? ?Please continue daily skin protection including broad spectrum sunscreen SPF 30+ to sun-exposed areas, reapplying every 2 hours as needed when you're outdoors.   ? ?If You Need Anything After Your Visit ? ?If you have any questions or concerns for your doctor, please call our main line at [phone] press option 4 to reach your doctor's medical assistant. If no one answers, please leave a voicemail as directed and we will  return your call as soon as possible. Messages left after 4 pm will be answered the following business day.  ? ?You may also send [place] message via MyChart. We typically respond to MyChart messages within 1-2 business days. ? ?For prescription refills, please ask your pharmacy to contact our office. Our fax number is [phone] ? ?If you have an urgent issue when the clinic is closed that cannot wait until the next business day, you can page your doctor at the number below.   ? ?Please note that while we do our best to be available for urgent issues outside of office hours, we are not available 24/7.  ? ?If you have an urgent issue and are unable to reach [place] you may choose to seek medical care at your doctor's office, retail clinic, urgent care center, or emergency room. ? ?If you have a medical emergency, please immediately call 911 or go to the emergency department. ? ?Pager Numbers ? ?- Dr. KNehemiah Massed [phone]? ?- Dr. MLaurence Ferrari [phone]? ?- Dr. SNicole Kindred [phone]? ?In the event of inclement weather, please call our main line at [phone] an update on the status of any delays or closures. ? ?Dermatology Medication Tips: ?Please keep the boxes that topical medications come in in order to help keep track of the instructions about where and how to use these. Pharmacies typically print the medication  instructions only on the boxes and not directly on the medication tubes.  ? ?If your medication is too expensive, please contact our office at [phone] option 4 or send [place] a message through [place].  ? ?We are unable to tell what your co-pay for medications will be in advance as this is different depending on your insurance coverage. However, we may be able to find a substitute medication at lower cost or fill out paperwork to get insurance to cover a needed medication.  ? ?If a prior authorization is required to get your medication covered by your insurance company, please allow [place] 1-2 business  days to complete this process. ? ?Drug prices often vary depending on where the prescription is filled and some pharmacies may offer cheaper prices. ? ?The website www.goodrx.com contains coupons for medications through different pharmacies. The prices here do not account for what the cost may be with help from insurance (it may be cheaper with your insurance), but the website can give you the price if you did not use any insurance.  ?- You can print the associated coupon and take it with your prescription to the pharmacy.  ?- You may also stop by our office during regular business hours and pick up a GoodRx coupon card.  ?- If you need your prescription sent electronically to a different pharmacy, notify our office through [facility] or by phone at [phone] option 4. ? ? ? ? ?Si Usted Necesita Algo Despu?s de Su Visita ? ?Tambi?n puede enviarnos un mensaje a trav?s de MyChart. Por lo general respondemos a los mensajes de MyChart en el transcurso de 1 a 2 d?as h?biles. ? ?Para renovar recetas, por favor pida a su farmacia que se ponga en contacto con nuestra oficina. Nuestro n?mero de fax es el [phone]. ? ?Si tiene un asunto urgente cuando la cl?nica est? cerrada y que no puede esperar hasta el siguiente d?a h?bil, puede llamar/localizar a su doctor(a) al n?mero que aparece a continuaci?n.  ? ?Por favor, tenga en cuenta que aunque hacemos todo lo posible para estar disponibles para asuntos urgentes fuera del horario de oficina, no estamos disponibles las 24 horas del d?a, los 7 d?as de la semana.  ? ?Si tiene un problema urgente y no puede comunicarse con nosotros, puede optar por buscar atenci?n m?dica  en el consultorio de su doctor(a), en una cl?nica privada, en un centro de atenci?n urgente o en una sala de emergencias. ? ?Si tiene Engineer, maintenance (IT) m?dica, por favor llame inmediatamente al 911 o vaya a la sala de emergencias. ? ?N?meros de b?per ? ?- Dr. Nehemiah Massed: [phone] ? ?- Dra. Moye:  [phone] ? ?- Dra. Nicole Kindred: [phone] ? ?En caso de inclemencias del tiempo, por favor llame a nuestra l?nea principal al [phone] para una actualizaci?n sobre el estado de cualquier retraso o cierre. ? ?Consejos para la medicaci?n en dermatolog?a: ?Por favor, guarde las cajas en las que vienen los medicamentos de uso t?pico para ayudarle a seguir las instrucciones sobre d?nde y c?mo usarlos. Las farmacias generalmente imprimen las instrucciones del medicamento s?lo en las cajas y no directamente en los tubos del [place].  ? ?Si su medicamento es muy caro, por favor, p?ngase en contacto con Zigmund Daniel llamando al [phone] y presione la opci?n 4 o env?enos un mensaje a trav?s de MyChart.  ? ?No podemos decirle cu?l ser? su copago por los medicamentos por adelantado ya que esto es diferente dependiendo de la cobertura de su seguro. Sin embargo,  es posible que podamos encontrar un medicamento sustituto a Electrical engineer un formulario para que el seguro cubra el medicamento que se considera necesario.  ? ?Si se requiere Ardelia Mems autorizaci?n previa para que su compa??a de seguros [place] su medicamento, por favor perm?tanos de 1 a 2 d?as h?biles para completar este proceso. ? ?Los precios de los medicamentos var?an con frecuencia dependiendo del Environmental consultant de d?nde se surte la receta y alguna farmacias pueden ofrecer precios m?s baratos. ? ?El sitio web www.goodrx.com tiene cupones para medicamentos de Airline pilot. Los precios aqu? no tienen en cuenta lo que podr?a costar con la ayuda del seguro (puede ser m?s barato con su seguro), pero el sitio web puede darle el precio si no utiliz? ning?n seguro.  ?- Puede imprimir el cup?n correspondiente y llevarlo con su receta a la farmacia.  ?- Tambi?n puede pasar por nuestra oficina durante el horario de atenci?n regular y recoger una tarjeta de cupones de GoodRx.  ?- Si necesita que su receta se env?e electr?nicamente a Chiropodist,  informe a nuestra oficina a trav?s de MyChart de [place] o por tel?fono llamando al [phone] y presione la opci?n 4. ? ?

## 2021-07-18 NOTE — Progress Notes (Signed)
? ?Follow-Up Visit ?  ?Subjective  ?Katrina Fuentes is a 66 y.o. female who presents for the following: Annual Exam (Patient here for full body skin exam and skin cancer screening. Patient with hx of AK's, she does have some rough spots at chest.). ? ? ? ?The following portions of the chart were reviewed this encounter and updated as appropriate:  ?  ?  ? ?Review of Systems:  No other skin or systemic complaints except as noted in HPI or Assessment and Plan. ? ?Objective  ?Well appearing patient in no apparent distress; mood and affect are within normal limits. ? ?A full examination was performed including scalp, head, eyes, ears, nose, lips, neck, chest, axillae, abdomen, back, buttocks, bilateral upper extremities, bilateral lower extremities, hands, feet, fingers, toes, fingernails, and toenails. All findings within normal limits unless otherwise noted below. ? ?abdomen, legs ?right anterior ankle 2 mm dark blue macule  ?right anterior medial ankle 2 mm medium dark brown macule  ?right medial foot 4 mm med/light brown macule   ?right upper abdomen 3.5 mm brown macule  ?Left pretibia 2.5 mm medium brown macule ? ? ? ? ? ? ?left upper pretibia ?4 mm pink white firm indistinct papule ? ? ? ? ? ? ?Scalp ?Pink scaly patch at occipital scalp ? ?chest (5) ?Erythematous thin papules/macules with gritty scale.  ? ? ? ?Assessment & Plan  ?Nevus ?abdomen, legs ? ?Benign-appearing.  Stable. Observation.  Call clinic for new or changing moles.  Recommend daily use of broad spectrum spf 30+ sunscreen to sun-exposed areas.   ? ?Dermatofibroma ?left upper pretibia ? ?vrs other, improved post cryotherapy but not clear ?Benign-appearing.  Observation.  Call clinic for new or changing lesions.  Recommend daily use of broad spectrum spf 30+ sunscreen to sun-exposed areas.  ? ?Patient to RTC if changes, consider biopsy ? ? ?Psoriasis ?Scalp ? ?Scalp ?Chronic condition with duration or expected duration over one year. Currently  well-controlled.  ? ?Psoriasis is a chronic non-curable, but treatable genetic/hereditary disease that may have other systemic features affecting other organ systems such as joints (Psoriatic Arthritis). It is associated with an increased risk of inflammatory bowel disease, heart disease, non-alcoholic fatty liver disease, and depression.   ? ?Continue clobetasol solution 1-2 times daily to scalp as needed. Avoid applying to face, groin, and axilla. Use as directed. Long-term use can cause thinning of the skin. ? ?Topical steroids (such as triamcinolone, fluocinolone, fluocinonide, mometasone, clobetasol, halobetasol, betamethasone, hydrocortisone) can cause thinning and lightening of the skin if they are used for too long in the same area. Your physician has selected the right strength medicine for your problem and area affected on the body. Please use your medication only as directed by your physician to prevent side effects.  ? ? ?AK (actinic keratosis) (5) ?chest ? ?Discussed PDT vrs topical 5FU/VitD cream ? ?Patient will call if she would like to do PDT to the chest this fall or treat with topical cream.  ? ?Destruction of lesion - chest ? ?Destruction method: cryotherapy   ?Informed consent: discussed and consent obtained   ?Lesion destroyed using liquid nitrogen: Yes   ?Region frozen until ice ball extended beyond lesion: Yes   ?Outcome: patient tolerated procedure well with no complications   ?Post-procedure details: wound care instructions given   ?Additional details:  Prior to procedure, discussed risks of blister formation, small wound, skin dyspigmentation, or rare scar following cryotherapy. Recommend Vaseline ointment to treated areas while healing.  ? ? ?  Lentigines ?- Scattered tan macules ?- Due to sun exposure ?- Benign-appearing, observe ?- Recommend daily broad spectrum sunscreen SPF 30+ to sun-exposed areas, reapply every 2 hours as needed. ?- Call for any changes ? ?Seborrheic Keratoses ?-  Stuck-on, waxy, tan-brown papules and/or plaques  ?- Benign-appearing ?- Discussed benign etiology and prognosis. ?- Observe ?- Call for any changes ? ?Melanocytic Nevi ?- Tan-brown and/or pink-flesh-colored symmetric macules and papules ?- Benign appearing on exam today ?- Observation ?- Call clinic for new or changing moles ?- Recommend daily use of broad spectrum spf 30+ sunscreen to sun-exposed areas.  ? ?Hemangiomas ?- Red papules ?- Discussed benign nature ?- Observe ?- Call for any changes ? ?Actinic Damage - Severe, confluent actinic changes with pre-cancerous actinic keratoses  ?- Severe, chronic, not at goal, secondary to cumulative UV radiation exposure over time ?- diffuse scaly erythematous macules and papules with underlying dyspigmentation ?- Discussed Prescription "Field Treatment" for Severe, Chronic Confluent Actinic Changes with Pre-Cancerous Actinic Keratoses ?Field treatment involves treatment of an entire area of skin that has confluent Actinic Changes (Sun/ Ultraviolet light damage) and PreCancerous Actinic Keratoses by method of PhotoDynamic Therapy (PDT) and/or prescription Topical Chemotherapy agents such as 5-fluorouracil, 5-fluorouracil/calcipotriene, and/or imiquimod.  The purpose is to decrease the number of clinically evident and subclinical PreCancerous lesions to prevent progression to development of skin cancer by chemically destroying early precancer changes that may or may not be visible.  It has been shown to reduce the risk of developing skin cancer in the treated area. As a result of treatment, redness, scaling, crusting, and open sores may occur during treatment course. One or more than one of these methods may be used and may have to be used several times to control, suppress and eliminate the PreCancerous changes. Discussed treatment course, expected reaction, and possible side effects. ?- Recommend daily broad spectrum sunscreen SPF 30+ to sun-exposed areas, reapply every 2  hours as needed.  ?- Staying in the shade or wearing long sleeves, sun glasses (UVA+UVB protection) and wide brim hats (4-inch brim around the entire circumference of the hat) are also recommended. ?- Call for new or changing lesions. ? ?Purpura - Chronic; persistent and recurrent.  Treatable, but not curable. ?- Violaceous macules and patches ?- Benign ?- Related to trauma, age, sun damage and/or use of blood thinners, chronic use of topical and/or oral steroids ?- Observe ?- Can use OTC arnica containing moisturizer such as Dermend Bruise Formula if desired ?- Call for worsening or other concerns ? ?Cafe au Lait  ?- Tan patch at left lower hip ?- Genetic ?- Benign, observe ?- Call for any changes ? ?Telangiectasia ?- Dilated blood vessels at legs ?- Benign appearing on exam ?- Call for changes ? ?Skin cancer screening performed today. ? ?Return in about 1 year (around 07/19/2022) for TBSE. ? ?Graciella Belton, RMA, am acting as scribe for Brendolyn Patty, MD . ? ?Documentation: I have reviewed the above documentation for accuracy and completeness, and I agree with the above. ? ?Brendolyn Patty MD  ? ?

## 2021-11-17 ENCOUNTER — Other Ambulatory Visit: Payer: Self-pay | Admitting: Internal Medicine

## 2021-12-01 ENCOUNTER — Ambulatory Visit
Admission: RE | Admit: 2021-12-01 | Discharge: 2021-12-01 | Disposition: A | Discharge status: Home / Self Care | Payer: BC Managed Care – PPO | Source: Ambulatory Visit | Attending: Internal Medicine | Admitting: Internal Medicine

## 2021-12-01 DIAGNOSIS — Z1231 Encounter for screening mammogram for malignant neoplasm of breast: Secondary | ICD-10-CM | POA: Insufficient documentation

## 2022-05-19 ENCOUNTER — Other Ambulatory Visit: Payer: Self-pay | Admitting: Internal Medicine

## 2022-06-21 DIAGNOSIS — Z23 Encounter for immunization: Secondary | ICD-10-CM | POA: Diagnosis not present

## 2022-06-21 DIAGNOSIS — Z7189 Other specified counseling: Secondary | ICD-10-CM | POA: Diagnosis not present

## 2022-07-04 DIAGNOSIS — D3610 Benign neoplasm of peripheral nerves and autonomic nervous system, unspecified: Secondary | ICD-10-CM | POA: Diagnosis not present

## 2022-07-24 ENCOUNTER — Ambulatory Visit (INDEPENDENT_AMBULATORY_CARE_PROVIDER_SITE_OTHER): Payer: Medicare Other | Admitting: Dermatology

## 2022-07-24 ENCOUNTER — Encounter: Payer: Self-pay | Admitting: Dermatology

## 2022-07-24 VITALS — BP 101/64 | HR 66

## 2022-07-24 DIAGNOSIS — L578 Other skin changes due to chronic exposure to nonionizing radiation: Secondary | ICD-10-CM

## 2022-07-24 DIAGNOSIS — L739 Follicular disorder, unspecified: Secondary | ICD-10-CM

## 2022-07-24 DIAGNOSIS — L918 Other hypertrophic disorders of the skin: Secondary | ICD-10-CM

## 2022-07-24 DIAGNOSIS — D2271 Melanocytic nevi of right lower limb, including hip: Secondary | ICD-10-CM

## 2022-07-24 DIAGNOSIS — L814 Other melanin hyperpigmentation: Secondary | ICD-10-CM

## 2022-07-24 DIAGNOSIS — Z1283 Encounter for screening for malignant neoplasm of skin: Secondary | ICD-10-CM

## 2022-07-24 DIAGNOSIS — L820 Inflamed seborrheic keratosis: Secondary | ICD-10-CM | POA: Diagnosis not present

## 2022-07-24 DIAGNOSIS — L821 Other seborrheic keratosis: Secondary | ICD-10-CM

## 2022-07-24 DIAGNOSIS — L409 Psoriasis, unspecified: Secondary | ICD-10-CM

## 2022-07-24 DIAGNOSIS — D225 Melanocytic nevi of trunk: Secondary | ICD-10-CM

## 2022-07-24 DIAGNOSIS — B081 Molluscum contagiosum: Secondary | ICD-10-CM

## 2022-07-24 DIAGNOSIS — D2272 Melanocytic nevi of left lower limb, including hip: Secondary | ICD-10-CM

## 2022-07-24 DIAGNOSIS — L719 Rosacea, unspecified: Secondary | ICD-10-CM | POA: Diagnosis not present

## 2022-07-24 MED ORDER — METRONIDAZOLE 0.75 % EX CREA: TOPICAL_CREAM | CUTANEOUS | 11 refills | Status: AC

## 2022-07-24 MED ORDER — CLOBETASOL PROPIONATE 0.05 % EX SOLN: CUTANEOUS | 3 refills | Status: AC

## 2022-07-24 MED ORDER — CLINDAMYCIN PHOSPHATE 1 % EX FOAM: CUTANEOUS | 3 refills | Status: AC

## 2022-07-24 MED ORDER — MUPIROCIN 2 % EX OINT: TOPICAL_OINTMENT | CUTANEOUS | 2 refills | Status: AC

## 2022-07-24 NOTE — Patient Instructions (Addendum)
Melanoma ABCDEs  Melanoma is the most dangerous type of skin cancer, and is the leading cause of death from skin disease.  You are more likely to develop melanoma if you: Have light-colored skin, light-colored eyes, or red or blond hair Spend a lot of time in the sun Tan regularly, either outdoors or in a tanning bed Have had blistering sunburns, especially during childhood Have a close family member who has had a melanoma Have atypical moles or large birthmarks  Early detection of melanoma is key since treatment is typically straightforward and cure rates are extremely high if we catch it early.   The first sign of melanoma is often a change in a mole or a new dark spot.  The ABCDE system is a way of remembering the signs of melanoma.  A for asymmetry:  The two halves do not match. B for border:  The edges of the growth are irregular. C for color:  A mixture of colors are present instead of an even brown color. D for diameter:  Melanomas are usually (but not always) greater than 66mm - the size of a pencil eraser. E for evolution:  The spot keeps changing in size, shape, and color.  Please check your skin once per month between visits. You can use a small mirror in front and a large mirror behind you to keep an eye on the back side or your body.   If you see any new or changing lesions before your next follow-up, please call to schedule a visit.  Please continue daily skin protection including broad spectrum sunscreen SPF 30+ to sun-exposed areas, reapplying every 2 hours as needed when you're outdoors.   Staying in the shade or wearing long sleeves, sun glasses (UVA+UVB protection) and wide brim hats (4-inch brim around the entire circumference of the hat) are also recommended for sun protection.     Rosacea  What is rosacea? Rosacea (say: ro-zay-sha) is a common skin disease that usually begins as a trend of flushing or blushing easily.  As rosacea progresses, a persistent redness  in the center of the face will develop and may gradually spread beyond the nose and cheeks to the forehead and chin.  In some cases, the ears, chest, and back could be affected.  Rosacea may appear as tiny blood vessels or small red bumps that occur in crops.  Frequently they can contain pus, and are called "pustules".  If the bumps do not contain pus, they are referred to as "papules".  Rarely, in prolonged, untreated cases of rosacea, the oil glands of the nose and cheeks may become permanently enlarged.  This is called rhinophyma, and is seen more frequently in men.  Signs and Risks In its beginning stages, rosacea tends to come and go, which makes it difficult to recognize.  It can start as intermittent flushing of the face.  Eventually, blood vessels may become permanently visible.  Pustules and papules can appear, but can be mistaken for adult acne.  People of all races, ages, genders and ethnic groups are at risk of developing rosacea.  However, it is more common in women (especially around menopause) and adults with fair skin between the ages of 97 and 92.  Treatment Dermatologists typically recommend a combination of treatments to effectively manage rosacea.  Treatment can improve symptoms and may stop the progression of the rosacea.  Treatment may involve both topical and oral medications.  The tetracycline antibiotics are often used for their anti-inflammatory effect; however, because  of the possibility of developing antibiotic resistance, they should not be used long term at full dose.  For dilated blood vessels the options include electrodessication (uses electric current through a small needle), laser treatment, and cosmetics to hide the redness.   With all forms of treatment, improvement is a slow process, and patients may not see any results for the first 3-4 weeks.  It is very important to avoid the sun and other triggers.  Patients must wear sunscreen daily.  Skin Care  Instructions: Cleanse the skin with a mild soap such as CeraVe cleanser, Cetaphil cleanser, or Dove soap once or twice daily as needed. Moisturize with Eucerin Redness Relief Daily Perfecting Lotion (has a subtle green tint), CeraVe Moisturizing Cream, or Oil of Olay Daily Moisturizer with sunscreen every morning and/or night as recommended. Makeup should be "non-comedogenic" (won't clog pores) and be labeled "for sensitive skin". Good choices for cosmetics are: Neutrogena, Almay, and Physician's Formula.  Any product with a green tint tends to offset a red complexion. If your eyes are dry and irritated, use artificial tears 2-3 times per day and cleanse the eyelids daily with baby shampoo.  Have your eyes examined at least every 2 years.  Be sure to tell your eye doctor that you have rosacea. Alcoholic beverages tend to cause flushing of the skin, and may make rosacea worse. Always wear sunscreen, protect your skin from extreme hot and cold temperatures, and avoid spicy foods, hot drinks, and mechanical irritation such as rubbing, scrubbing, or massaging the face.  Avoid harsh skin cleansers, cleansing masks, astringents, and exfoliation. If a particular product burns or makes your face feel tight, then it is likely to flare your rosacea. If you are having difficulty finding a sunscreen that you can tolerate, you may try switching to a chemical-free sunscreen.  These are ones whose active ingredient is zinc oxide or titanium dioxide only.  They should also be fragrance free, non-comedogenic, and labeled for sensitive skin. Rosacea triggers may vary from person to person.  There are a variety of foods that have been reported to trigger rosacea.  Some patients find that keeping a diary of what they were doing when they flared helps them avoid triggers.   Viral Warts & Molluscum Contagiosum  Viral warts and molluscum contagiosum are growths of the skin caused by viral infection of the skin. If you have  been given the diagnosis of viral warts or molluscum contagiosum there are a few things that you must understand about your condition:  There is no guaranteed treatment method available for this condition. Multiple treatments may be required, The treatments may be time consuming and require multiple visits to the dermatology office. The treatment may be expensive. You will be charged each time you come into the office to have the spots treated. The treated areas may develop new lesions further complicating treatment. The treated areas may leave a scar. There is no guarantee that even after multiple treatments that the spots will be successfully treated. These are caused by a viral infection and can be spread to other areas of the skin and to other people by direct contact. Therefore, new spots may occur.  Due to recent changes in healthcare laws, you may see results of your pathology and/or laboratory studies on MyChart before the doctors have had a chance to review them. We understand that in some cases there may be results that are confusing or concerning to you. Please understand that not all results are received  at the same time and often the doctors may need to interpret multiple results in order to provide you with the best plan of care or course of treatment. Therefore, we ask that you please give [place] 2 business days to thoroughly review all your results before contacting the office for clarification. Should we see a critical lab result, you will be contacted sooner.   If You Need Anything After Your Visit  If you have any questions or concerns for your doctor, please call our main line at [phone] and press option 4 to reach your doctor's medical assistant. If no one answers, please leave a voicemail as directed and we will return your call as soon as possible. Messages left after 4 pm will be answered the following business day.   You may also send [place] a message via [place]. We typically  respond to MyChart messages within 1-2 business days.  For prescription refills, please ask your pharmacy to contact our office. Our fax number is [phone].  If you have an urgent issue when the clinic is closed that cannot wait until the next business day, you can page your doctor at the number below.    Please note that while we do our best to be available for urgent issues outside of office hours, we are not available 24/7.   If you have an urgent issue and are unable to reach [place], you may choose to seek medical care at your doctor's office, retail clinic, urgent care center, or emergency room.  If you have a medical emergency, please immediately call 911 or go to the emergency department.  Pager Numbers  - Dr. Nehemiah Massed: [phone]  - Dr. Laurence Ferrari: [phone]  - Dr. Nicole Kindred: [phone]  In the event of inclement weather, please call our main line at [phone] for an update on the status of any delays or closures.  Dermatology Medication Tips: Please keep the boxes that topical medications come in in order to help keep track of the instructions about where and how to use these. Pharmacies typically print the medication instructions only on the boxes and not directly on the medication tubes.   If your medication is too expensive, please contact our office at [phone] option 4 or send [place] a message through [place].   We are unable to tell what your co-pay for medications will be in advance as this is different depending on your insurance coverage. However, we may be able to find a substitute medication at lower cost or fill out paperwork to get insurance to cover a needed medication.   If a prior authorization is required to get your medication covered by your insurance company, please allow [place] 1-2 business days to complete this process.  Drug prices often vary depending on where the prescription is filled and some pharmacies may offer cheaper prices.  The website  www.goodrx.com contains coupons for medications through different pharmacies. The prices here do not account for what the cost may be with help from insurance (it may be cheaper with your insurance), but the website can give you the price if you did not use any insurance.  - You can print the associated coupon and take it with your prescription to the pharmacy.  - You may also stop by our office during regular business hours and pick up a GoodRx coupon card.  - If you need your prescription sent electronically to a different pharmacy, notify our office through [facility] or by phone at [phone] option 4.  Si Usted Necesita Algo Despus de Su Visita  Tambin puede enviarnos un mensaje a travs de Pharmacist, community. Por lo general respondemos a los mensajes de MyChart en el transcurso de 1 a 2 das hbiles.  Para renovar recetas, por favor pida a su farmacia que se ponga en contacto con nuestra oficina. Harland Dingwall de fax es [place] [phone].  Si tiene un asunto urgente cuando la clnica est cerrada y que no puede esperar hasta el siguiente da hbil, puede llamar/localizar a su doctor(a) al nmero que aparece a continuacin.   Por favor, tenga en cuenta que aunque hacemos todo lo posible para estar disponibles para asuntos urgentes fuera del horario de [place], no estamos disponibles las 24 horas del da, los 7 das de la [place].   Si tiene un problema urgente y no puede comunicarse con nosotros, puede optar por buscar atencin mdica  en el consultorio de su doctor(a), en una clnica privada, en un centro de atencin urgente o en una sala de emergencias.  Si tiene Engineering geologist, por favor llame inmediatamente al 911 o vaya a la sala de emergencias.  Nmeros de bper  - Dr. Nehemiah Massed: [phone]  - Dra. Moye: [phone]  - Dra. Nicole Kindred: [phone]  En caso de inclemencias del [place], por favor llame a Johnsie Kindred principal al [phone] para una actualizacin  sobre el [place] de cualquier retraso o cierre.  Consejos para la medicacin en dermatologa: Por favor, guarde las cajas en las que vienen los medicamentos de uso tpico para ayudarle a seguir las instrucciones sobre dnde y cmo usarlos. Las farmacias generalmente imprimen las instrucciones del medicamento slo en las cajas y no directamente en los tubos del [place].   Si su medicamento es muy caro, por favor, pngase en contacto con Zigmund Daniel llamando al [phone] y presione la opcin 4 o envenos un mensaje a travs de Pharmacist, community.   No podemos decirle cul ser su copago por los medicamentos por adelantado ya que esto es diferente dependiendo de la cobertura de su seguro. Sin embargo, es posible que podamos encontrar un medicamento sustituto a Electrical engineer un formulario para que el seguro cubra el medicamento que se considera necesario.   Si se requiere una autorizacin previa para que su compaa de seguros [place] su medicamento, por favor permtanos de 1 a 2 das hbiles para completar este proceso.  Los precios de los medicamentos varan con frecuencia dependiendo del Environmental consultant de dnde se surte la receta y alguna farmacias pueden ofrecer precios ms baratos.  El sitio web www.goodrx.com tiene cupones para medicamentos de Airline pilot. Los precios aqu no tienen en cuenta lo que podra costar con la ayuda del seguro (puede ser ms barato con su seguro), pero el sitio web puede darle el precio si no utiliz Research scientist (physical sciences).  - Puede imprimir el cupn correspondiente y llevarlo con su receta a la farmacia.  - Tambin puede pasar por nuestra oficina durante el horario de atencin regular y Charity fundraiser una tarjeta de cupones de GoodRx.  - Si necesita que su receta se enve electrnicamente a una farmacia diferente, informe a nuestra oficina a travs de MyChart de [place] o por telfono llamando al [phone] y presione la opcin 4.

## 2022-07-24 NOTE — Progress Notes (Signed)
Follow-Up Visit   Subjective  Katrina Fuentes is a 67 y.o. female who presents for the following: Skin Cancer Screening and Full Body Skin Exam  The patient presents for Total-Body Skin Exam (TBSE) for skin cancer screening and mole check. The patient has spots, moles and lesions to be evaluated, some may be new or changing. She has a rough spot on her left thigh she would like checked- she picks at. History of AKs. Psoriasis of the scalp is controlled with clobetasol solution.     The following portions of the chart were reviewed this encounter and updated as appropriate: medications, allergies, medical history  Review of Systems:  No other skin or systemic complaints except as noted in HPI or Assessment and Plan.  Objective  Well appearing patient in no apparent distress; mood and affect are within normal limits.  A full examination was performed including scalp, head, eyes, ears, nose, lips, neck, chest, axillae, abdomen, back, buttocks, bilateral upper extremities, bilateral lower extremities, hands, feet, fingers, toes, fingernails, and toenails. All findings within normal limits unless otherwise noted below.   Relevant physical exam findings are noted in the Assessment and Plan.    Assessment & Plan   LENTIGINES, SEBORRHEIC KERATOSES, HEMANGIOMAS - Benign normal skin lesions - Benign-appearing - Call for any changes Counseling for BBL / IPL / Laser and Coordination of Care for lentigos Discussed the treatment option of Broad Band Light (BBL) /Intense Pulsed Light (IPL)/ Laser for skin discoloration, including brown spots and redness.  Typically we recommend at least 1-3 treatment sessions about 5-8 weeks apart for best results.  Cannot have tanned skin when BBL performed, and regular use of sunscreen is advised after the procedure to help maintain results. The patient's condition may also require "maintenance treatments" in the future.  The fee for BBL / laser treatments is  $350 per treatment session for the whole face.  A fee can be quoted for other parts of the body.  Insurance typically does not pay for BBL/laser treatments and therefore the fee is an out-of-pocket cost.   MELANOCYTIC NEVI - Tan-brown and/or pink-flesh-colored symmetric macules and papules - right anterior ankle 2 mm dark blue macule (Blue Nevus) - right anterior medial ankle 2 mm medium dark brown macule  - right medial foot 4 mm med/light brown macule   - right upper abdomen 3.5 mm brown macule  - left pretibia 2.5 mm medium brown macule - Benign appearing on exam today, stable from previous visit - Observation - Call clinic for new or changing moles - Recommend daily use of broad spectrum spf 30+ sunscreen to sun-exposed areas.   MOLLUSCUM CONTAGIOSUM Exam: Smooth, pink/flesh dome-shaped papules with central umbilication of the left axilla - Discussed viral etiology and contagion.  Molluscum are small wart-like bumps caused by a viral infection in the skin and is easily spread.  It may be more common and more easily spread in children who have eczema, because of dry inflamed skin and frequent scratching.  Use your prescription topical eczema medication as directed if prescribed.  Recommend routine use of mild soap and moisturizing cream to prevent spread.  Do not share towels.  Multiple treatments may be required to clear molluscum.  New spots may occur, even when treated ones clear.  Treatment Plan: Discussed cryotherapy treatment, patient defers today.  May try The Perfect A qhs, risk of skin irritation.   Acrochordons (Skin Tags) - Fleshy, skin-colored pedunculated papules of the right axilla - Benign appearing.  -  Observe. - If desired, they can be removed with an in office procedure that is not covered by insurance. - Please call the clinic if you notice any new or changing lesions.  ACTINIC DAMAGE - Chronic condition, secondary to cumulative UV/sun exposure - diffuse scaly  erythematous macules with underlying dyspigmentation - Recommend daily broad spectrum sunscreen SPF 30+ to sun-exposed areas, reapply every 2 hours as needed.  - Staying in the shade or wearing long sleeves, sun glasses (UVA+UVB protection) and wide brim hats (4-inch brim around the entire circumference of the hat) are also recommended for sun protection.  - Call for new or changing lesions.  SKIN CANCER SCREENING PERFORMED TODAY.   FOLLICULITIS Exam: Pink crusted papules on the shoulders and arms.  Treatment Plan: Continue Clindamycin foam Apply to AA QD after shower   INFLAMED SEBORRHEIC KERATOSIS Exam: Erythematous keratotic or waxy stuck-on papule  Symptomatic, irritating, patient would like treated.  Benign-appearing.  Call clinic for new or changing lesions.   Prior to procedure, discussed risks of blister formation, small wound, skin dyspigmentation, or rare scar following treatment. Recommend Vaseline ointment to treated areas while healing. Mupirocin 2% ointment QD/BID until healed.  Destruction Procedure Note Destruction method: cryotherapy   Informed consent: discussed and consent obtained   Lesion destroyed using liquid nitrogen: Yes   Outcome: patient tolerated procedure well with no complications   Post-procedure details: wound care instructions given   Locations: left ant thigh x 1, left chest x 1 # of Lesions Treated: 2   ROSACEA Exam Inflammatory papules of the right lateral eye; telangiectasias of the cheeks, nose  Chronic and persistent condition with duration or expected duration over one year. Condition is bothersome/symptomatic for patient. Currently flared.   Rosacea is a chronic progressive skin condition usually affecting the face of adults, causing redness and/or acne bumps. It is treatable but not curable. It sometimes affects the eyes (ocular rosacea) as well. It may respond to topical and/or systemic medication and can flare with stress, sun  exposure, alcohol, exercise, topical steroids (including hydrocortisone/cortisone 10) and some foods.  Daily application of broad spectrum spf 30+ sunscreen to face is recommended to reduce flares.   Treatment Plan Restart Metronidazole cream 0.75% Apply to face QD/BID dsp 45g 11Rf.   PSORIASIS Exam: Mild erythema and scale of the occipital scalp and frontal hairline  Chronic and persistent condition with duration or expected duration over one year. Condition is symptomatic/ bothersome to patient. Not currently at goal.    Psoriasis is a chronic non-curable, but treatable genetic/hereditary disease that may have other systemic features affecting other organ systems such as joints (Psoriatic Arthritis). It is associated with an increased risk of inflammatory bowel disease, heart disease, non-alcoholic fatty liver disease, and depression.  Treatments include light and laser treatments; topical medications; and systemic medications including oral and injectables.  Treatment Plan: Continue Clobetasol solution Apply 1-2 gtts QD/BID to scalp prn flares dsp 63mL 3Rf. Avoid face. Rx on hold.   Topical steroids (such as triamcinolone, fluocinolone, fluocinonide, mometasone, clobetasol, halobetasol, betamethasone, hydrocortisone) can cause thinning and lightening of the skin if they are used for too long in the same area. Your physician has selected the right strength medicine for your problem and area affected on the body. Please use your medication only as directed by your physician to prevent side effects.    Return in about 1 year (around 07/24/2023) for TBSE.  IJamesetta Orleans, CMA, am acting as scribe for Brendolyn Patty, MD .  Documentation: I have reviewed the above documentation for accuracy and completeness, and I agree with the above.  Brendolyn Patty, MD

## 2022-07-25 ENCOUNTER — Encounter: Payer: Self-pay | Admitting: Internal Medicine

## 2022-07-25 ENCOUNTER — Other Ambulatory Visit (HOSPITAL_COMMUNITY)
Admission: RE | Admit: 2022-07-25 | Discharge: 2022-07-25 | Disposition: A | Discharge status: Home / Self Care | Payer: Medicare Other | Source: Ambulatory Visit | Attending: Internal Medicine | Admitting: Internal Medicine

## 2022-07-25 ENCOUNTER — Ambulatory Visit (INDEPENDENT_AMBULATORY_CARE_PROVIDER_SITE_OTHER): Payer: Medicare Other | Admitting: Internal Medicine

## 2022-07-25 VITALS — BP 102/66 | HR 64 | Temp 98.0°F | Ht 64.25 in | Wt 104.0 lb

## 2022-07-25 DIAGNOSIS — Z0000 Encounter for general adult medical examination without abnormal findings: Secondary | ICD-10-CM

## 2022-07-25 DIAGNOSIS — Z1231 Encounter for screening mammogram for malignant neoplasm of breast: Secondary | ICD-10-CM

## 2022-07-25 DIAGNOSIS — R5383 Other fatigue: Secondary | ICD-10-CM

## 2022-07-25 DIAGNOSIS — Z124 Encounter for screening for malignant neoplasm of cervix: Secondary | ICD-10-CM | POA: Diagnosis not present

## 2022-07-25 DIAGNOSIS — M810 Age-related osteoporosis without current pathological fracture: Secondary | ICD-10-CM

## 2022-07-25 DIAGNOSIS — E7800 Pure hypercholesterolemia, unspecified: Secondary | ICD-10-CM

## 2022-07-25 DIAGNOSIS — R7301 Impaired fasting glucose: Secondary | ICD-10-CM | POA: Diagnosis not present

## 2022-07-25 DIAGNOSIS — Z1151 Encounter for screening for human papillomavirus (HPV): Secondary | ICD-10-CM | POA: Diagnosis not present

## 2022-07-25 DIAGNOSIS — N362 Urethral caruncle: Secondary | ICD-10-CM

## 2022-07-25 DIAGNOSIS — Z23 Encounter for immunization: Secondary | ICD-10-CM

## 2022-07-25 MED ORDER — RALOXIFENE HCL 60 MG PO TABS: 60.0000 mg | ORAL_TABLET | Freq: Every day | ORAL | 1 refills | Status: DC

## 2022-07-25 NOTE — Patient Instructions (Addendum)
Your annual mammogram AND  DEXA  SCAN have been ordered and can be done in mid August .    Please call [place] to call to make your appointments  .  The phone number for [place] is  [phone]

## 2022-07-25 NOTE — Progress Notes (Unsigned)
Patient ID: Katrina Fuentes, female    DOB: 1955-09-28  Age: 67 y.o. MRN: [id number]  The patient is here for annual preventive examination and management of other chronic and acute problems.   The risk factors are reflected in the social history.   The roster of all physicians providing medical care to patient - is listed in the Snapshot section of the chart.   Activities of daily living:  The patient is 100% independent in all ADLs: dressing, toileting, feeding as well as independent mobility   Home safety : The patient has smoke detectors in the home. They wear seatbelts.  There are no unsecured firearms at home. There is no violence in the home.    There is no risks for hepatitis, STDs or HIV. There is no   history of blood transfusion. They have no travel history to infectious disease endemic areas of the world.   The patient has seen their dentist in the last six month. They have seen their eye doctor in the last year. The patient  denies slight hearing difficulty with regard to whispered voices and some television programs.  They have deferred audiologic testing in the last year.  They do not  have excessive sun exposure. Discussed the need for sun protection: hats, long sleeves and use of sunscreen if there is significant sun exposure.    Diet: the importance of a healthy diet is discussed. They do have a healthy diet.   The benefits of regular aerobic exercise were discussed. The patient  exercises  3 to 5 days per week  for  60 minutes.    Depression screen: there are no signs or vegative symptoms of depression- irritability, change in appetite, anhedonia, sadness/tearfullness.   The following portions of the patient's history were reviewed and updated as appropriate: allergies, current medications, past family history, past medical history,  past surgical history, past social history  and problem list.   Visual acuity was not assessed per patient preference since the patient has  regular follow up with an  ophthalmologist. Hearing and body mass index were assessed and reviewed.    During the course of the visit the patient was educated and counseled about appropriate screening and preventive services including : fall prevention , diabetes screening, nutrition counseling, colorectal cancer screening, and recommended immunizations.    Chief Complaint:  Retired  from [employer]ical [employer] 2 years ago,  going to [place] this summer.  Had had typhoid and yellow fever vaccines.  Will use doxy for malaria prevention  Caregiver fatigue managing mother's health     Review of Symptoms  Patient denies headache, fevers, malaise, unintentional weight loss, skin rash, eye pain, sinus congestion and sinus pain, sore throat, dysphagia,  hemoptysis , cough, dyspnea, wheezing, chest pain, palpitations, orthopnea, edema, abdominal pain, nausea, melena, diarrhea, constipation, flank pain, dysuria, hematuria, urinary  Frequency, nocturia, numbness, tingling, seizures,  Focal weakness, Loss of consciousness,  Tremor, insomnia, depression, anxiety, and suicidal ideation.    Physical Exam:  BP 102/66   Pulse 64   Temp 98 F (36.7 C) (Oral)   Ht 5' 4.25" (1.632 m)   Wt 104 lb (47.2 kg)   SpO2 96%   BMI 17.71 kg/m    Physical Exam Vitals reviewed. Exam conducted with a chaperone present.  Constitutional:      General: She is not in acute distress.    Appearance: Normal appearance. She is well-developed and normal weight. She is not ill-appearing, toxic-appearing or diaphoretic.  HENT:  Head: Normocephalic.     Right Ear: Tympanic membrane, ear canal and external ear normal. There is no impacted cerumen.     Left Ear: Tympanic membrane, ear canal and external ear normal. There is no impacted cerumen.     Nose: Nose normal.     Mouth/Throat:     Mouth: Mucous membranes are moist.     Pharynx: Oropharynx is clear.  Eyes:     General: No scleral icterus.       Right eye: No  discharge.        Left eye: No discharge.     Conjunctiva/sclera: Conjunctivae normal.     Pupils: Pupils are equal, round, and reactive to light.  Neck:     Thyroid: No thyromegaly.     Vascular: No carotid bruit or JVD.  Cardiovascular:     Rate and Rhythm: Normal rate and regular rhythm.     Heart sounds: Normal heart sounds.  Pulmonary:     Effort: Pulmonary effort is normal. No respiratory distress.     Breath sounds: Normal breath sounds.  Chest:  Breasts:    Breasts are symmetrical.     Right: Normal. No swelling, inverted nipple, mass, nipple discharge, skin change or tenderness.     Left: Normal. No swelling, inverted nipple, mass, nipple discharge, skin change or tenderness.  Abdominal:     General: Bowel sounds are normal.     Palpations: Abdomen is soft. There is no mass.     Tenderness: There is no abdominal tenderness. There is no guarding or rebound.     Hernia: There is no hernia in the left inguinal area or right inguinal area.  Genitourinary:    Exam position: Lithotomy position.     Pubic Area: No rash or pubic lice.      Labia:        Right: No rash, tenderness, lesion or injury.        Left: No rash, tenderness, lesion or injury.      Vagina: Normal.     Cervix: Normal.     Uterus: Normal.      Adnexa: Right adnexa normal and left adnexa normal.       Comments: Urethral caruncle  Musculoskeletal:        General: Normal range of motion.     Cervical back: Normal range of motion and neck supple.  Lymphadenopathy:     Cervical: No cervical adenopathy.     Upper Body:     Right upper body: No supraclavicular, axillary or pectoral adenopathy.     Left upper body: No supraclavicular, axillary or pectoral adenopathy.     Lower Body: No right inguinal adenopathy. No left inguinal adenopathy.  Skin:    General: Skin is warm and dry.  Neurological:     General: No focal deficit present.     Mental Status: She is alert and oriented to person, place, and  time. Mental status is at baseline.  Psychiatric:        Mood and Affect: Mood normal.        Behavior: Behavior normal.        Thought Content: Thought content normal.        Judgment: Judgment normal.    Assessment and Plan: Encounter for screening mammogram for malignant neoplasm of breast -     3D Screening Mammogram, Left and Right; Future  Cervical cancer screening -     Cytology - PAP  Visit for preventive health examination  Assessment & Plan: age appropriate education and counseling updated, referrals for preventative services and immunizations addressed, dietary and smoking counseling addressed, most recent labs reviewed.  I have personally reviewed and have noted:   1) the patient's medical and social history 2) The pt's use of alcohol, tobacco, and illicit drugs 3) The patient's current medications and supplements 4) Functional ability including ADL's, fall risk, home safety risk, hearing and visual impairment 5) Diet and physical activities 6) Evidence for depression or mood disorder 7) The patient's height, weight, and BMI have been recorded in the chart     I have made referrals, and provided counseling and education based on review of the above    Impaired fasting glucose -     Comprehensive metabolic panel -     Hemoglobin A1c  Pure hypercholesterolemia -     Lipid panel -     LDL cholesterol, direct  Other fatigue -     CBC with Differential/Platelet -     TSH  Osteoporosis without current pathological fracture, unspecified osteoporosis type -     Raloxifene HCl; Take 1 tablet (60 mg total) by mouth daily.  Dispense: 90 tablet; Refill: 1 -     DG Bone Density; Future  Need for measles-mumps-rubella (MMR) vaccine -     Measles/Mumps/Rubella Immunity  Osteoporosis of femur without pathological fracture Assessment & Plan: Continue Evista. BMD has improved.  Next DEXA due August 2024   Urethral caruncle Assessment & Plan: [employer], noted on  exam today      No follow-ups on file.  Crecencio Mc, MD

## 2022-07-26 DIAGNOSIS — N362 Urethral caruncle: Secondary | ICD-10-CM | POA: Insufficient documentation

## 2022-07-26 LAB — CBC WITH DIFFERENTIAL/PLATELET
Basophils Absolute: 0.1 10*3/uL (ref 0.0–0.1)
Basophils Relative: 1.1 % (ref 0.0–3.0)
Eosinophils Absolute: 0.1 10*3/uL (ref 0.0–0.7)
Eosinophils Relative: 1.7 % (ref 0.0–5.0)
HCT: 40.6 % (ref 36.0–46.0)
Hemoglobin: 13.7 g/dL (ref 12.0–15.0)
Lymphocytes Relative: 30.9 % (ref 12.0–46.0)
Lymphs Abs: 1.7 10*3/uL (ref 0.7–4.0)
MCHC: 33.7 g/dL (ref 30.0–36.0)
MCV: 92.8 fl (ref 78.0–100.0)
Monocytes Absolute: 0.4 10*3/uL (ref 0.1–1.0)
Monocytes Relative: 7.5 % (ref 3.0–12.0)
Neutro Abs: 3.2 10*3/uL (ref 1.4–7.7)
Neutrophils Relative %: 58.8 % (ref 43.0–77.0)
Platelets: 186 10*3/uL (ref 150.0–400.0)
RBC: 4.37 Mil/uL (ref 3.87–5.11)
RDW: 13.5 % (ref 11.5–15.5)
WBC: 5.4 10*3/uL (ref 4.0–10.5)

## 2022-07-26 LAB — COMPREHENSIVE METABOLIC PANEL
ALT: 20 U/L (ref 0–35)
AST: 28 U/L (ref 0–37)
Albumin: 4.3 g/dL (ref 3.5–5.2)
Alkaline Phosphatase: 68 U/L (ref 39–117)
BUN: 15 mg/dL (ref 6–23)
CO2: 33 mEq/L — ABNORMAL HIGH (ref 19–32)
Calcium: 9.7 mg/dL (ref 8.4–10.5)
Chloride: 104 mEq/L (ref 96–112)
Creatinine, Ser: 0.83 mg/dL (ref 0.40–1.20)
GFR: 74.2 mL/min (ref >60.00)
Glucose, Bld: 105 mg/dL — ABNORMAL HIGH (ref 70–99)
Potassium: 4 mEq/L (ref 3.5–5.1)
Sodium: 143 mEq/L (ref 135–145)
Total Bilirubin: 0.5 mg/dL (ref 0.2–1.2)
Total Protein: 6.2 g/dL (ref 6.0–8.3)

## 2022-07-26 LAB — TSH: TSH: 1.76 u[IU]/mL (ref 0.35–5.50)

## 2022-07-26 LAB — LIPID PANEL
Cholesterol: 248 mg/dL — ABNORMAL HIGH (ref 0–200)
HDL: 112.6 mg/dL (ref >39.00)
LDL Cholesterol: 126 mg/dL — ABNORMAL HIGH (ref 0–99)
NonHDL: 135.86
Total CHOL/HDL Ratio: 2
Triglycerides: 47 mg/dL (ref 0.0–149.0)
VLDL: 9.4 mg/dL (ref 0.0–40.0)

## 2022-07-26 LAB — HEMOGLOBIN A1C: Hgb A1c MFr Bld: 6 % (ref 4.6–6.5)

## 2022-07-26 LAB — LDL CHOLESTEROL, DIRECT: Direct LDL: 104 mg/dL

## 2022-07-26 NOTE — Assessment & Plan Note (Signed)

## 2022-07-26 NOTE — Assessment & Plan Note (Signed)
[employer], noted on exam today

## 2022-07-26 NOTE — Assessment & Plan Note (Signed)
Continue Evista. BMD has improved.  Next DEXA due August 2024

## 2022-07-27 ENCOUNTER — Encounter: Payer: Self-pay | Admitting: Internal Medicine

## 2022-07-27 LAB — CYTOLOGY - PAP
Comment: NEGATIVE
Diagnosis: NEGATIVE
High risk HPV: NEGATIVE

## 2022-07-27 LAB — MEASLES/MUMPS/RUBELLA IMMUNITY
Mumps IgG: 75.2 AU/mL
Rubella: <0.9 Index — ABNORMAL LOW
Rubeola IgG: 118 AU/mL

## 2023-01-10 ENCOUNTER — Ambulatory Visit
Admission: RE | Admit: 2023-01-10 | Discharge: 2023-01-10 | Disposition: A | Discharge status: Home / Self Care | Payer: Medicare Other | Source: Ambulatory Visit | Attending: Internal Medicine | Admitting: Internal Medicine

## 2023-01-10 DIAGNOSIS — M810 Age-related osteoporosis without current pathological fracture: Secondary | ICD-10-CM

## 2023-01-10 DIAGNOSIS — Z1231 Encounter for screening mammogram for malignant neoplasm of breast: Secondary | ICD-10-CM | POA: Diagnosis present

## 2023-01-11 ENCOUNTER — Encounter: Payer: Self-pay | Admitting: Internal Medicine

## 2023-01-11 DIAGNOSIS — M810 Age-related osteoporosis without current pathological fracture: Secondary | ICD-10-CM

## 2023-02-04 ENCOUNTER — Encounter: Payer: Self-pay | Admitting: Internal Medicine

## 2023-02-04 DIAGNOSIS — M810 Age-related osteoporosis without current pathological fracture: Secondary | ICD-10-CM

## 2023-02-04 MED ORDER — RALOXIFENE HCL 60 MG PO TABS: 60.0000 mg | ORAL_TABLET | Freq: Every day | ORAL | 1 refills | Status: DC

## 2023-07-26 ENCOUNTER — Ambulatory Visit: Payer: Medicare Other | Admitting: Internal Medicine

## 2023-07-26 ENCOUNTER — Encounter: Payer: Self-pay | Admitting: *Deleted

## 2023-07-26 VITALS — BP 98/60 | HR 62 | Temp 98.0°F | Ht 64.0 in | Wt 100.8 lb

## 2023-07-26 DIAGNOSIS — R5383 Other fatigue: Secondary | ICD-10-CM

## 2023-07-26 DIAGNOSIS — Z0000 Encounter for general adult medical examination without abnormal findings: Secondary | ICD-10-CM | POA: Diagnosis not present

## 2023-07-26 DIAGNOSIS — R636 Underweight: Secondary | ICD-10-CM | POA: Diagnosis not present

## 2023-07-26 DIAGNOSIS — Z23 Encounter for immunization: Secondary | ICD-10-CM

## 2023-07-26 DIAGNOSIS — M810 Age-related osteoporosis without current pathological fracture: Secondary | ICD-10-CM

## 2023-07-26 MED ORDER — RALOXIFENE HCL 60 MG PO TABS: 60.0000 mg | ORAL_TABLET | Freq: Every day | ORAL | 1 refills | Status: DC

## 2023-07-26 NOTE — Progress Notes (Signed)
 The patient is here for the Welcome to  Medicare  preventive visit     has a past medical history of Actinic keratosis, Degenerative disk disease (2009), Neuromuscular disorder (HCC), and Neuromuscular disorder (HCC).    reports that she has never smoked. She has never used smokeless tobacco. She reports current alcohol use of about 7.0 standard drinks of alcohol per week. She reports that she does not use drugs.   The roster of all physicians providing medical care to patient : Patient Care Team: Sherlene Shams, MD as PCP - General (Internal Medicine) Debbe Odea, MD as PCP - Cardiology (Cardiology)  Activities of daily living:  The patient is 100% independent in all ADLs: dressing, toileting, feeding as well as independent mobility Fall risk was assessed by direct patient evaluation of patient's balance, gait and ability to risk from a chair and from a kneeling position. Home safety : The patient has smoke detectors in the home. They wear seatbelts.  There are no firearms at home. There is no violence in the home.  Patient has seen their eye doctor in the last year.   Visual acuity was assessed today  and was 20/20 with correction lenses. Patient denies hearing difficulty with regard to whispered voices and some television programs and has  deferred audiologic testing in the last year.   There is no risks for hepatitis, STDs or HIV. There is no   history of blood transfusion. They have no travel history to infectious disease endemic areas of the world.  The patient has seen their dentist in the last six month.  They do not  have excessive sun exposure. Discussed the need for sun protection: hats, long sleeves and use of sunscreen if there is significant sun exposure.   Diet: the importance of a healthy diet is discussed. They do have a healthy diet.  The benefits of regular aerobic exercise were discussed. Patient walks 4 times per week ,  a minimum of 20 minutes.   Depression  screen:      07/26/2023    1:41 PM 07/26/2023    1:35 PM 07/25/2022    3:51 PM 07/10/2021    9:43 AM 07/06/2020   10:07 AM  Depression screen PHQ 2/9  Decreased Interest -- 0 0 0 0  Down, Depressed, Hopeless -- 0 1 0 0  PHQ - 2 Score  0 1 0 0  Altered sleeping 0      Tired, decreased energy 0      Change in appetite 0      Feeling bad or failure about yourself  0      Trouble concentrating 0      Moving slowly or fidgety/restless 0      Suicidal thoughts 0      Difficult doing work/chores Not difficult at all         Caregiver fatigue:  mother recently fell at home  , right arm fracture. . 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 They could relate day,date,year and events; recalled 2/3 objects at 3 minutes; performed clock-face test normally.  The following portions of the patient's history were reviewed and updated as appropriate: allergies, current medications, past family history, past medical history,  past surgical history, past social history  and problem list.  During the course of the visit the patient was educated and counseled about appropriate screening and preventive services including :  fall prevention , diabetes screening, nutrition counseling, colorectal cancer screening, and recommended immunizations   Immunization History  Administered Date(s) Administered   Hepatitis A 04/07/2015   Hepatitis B 06/20/2001, 07/18/2001, 01/18/2002   Influenza Split 01/30/2012, 01/13/2014   Influenza, High Dose Seasonal PF 01/28/2023   Influenza-Unspecified 12/23/2015, 01/16/2018, 01/19/2019, 02/18/2020, 02/04/2021   PFIZER(Purple Top)SARS-COV-2 Vaccination 04/20/2019, 05/11/2019, 01/22/2020   Pfizer Covid-19 Vaccine Bivalent Booster 5y-11y 02/13/2021, 03/09/2022   Td 02/28/2017   Tdap 01/18/2010, 02/28/2017, 07/06/2020   Typhoid Inactivated 04/07/2015, 06/21/2022   Yellow Fever  08/06/2001   Zoster Recombinant(Shingrix) 01/16/2022, 05/01/2022  HMLISTPATIENTFRIENDLY@ Health Maintenance Due  Topic Date Due   Pneumonia Vaccine 5+ Years old (1 of 1 - PCV) Never done   COVID-19 Vaccine (6 - 2024-25 season) 12/23/2022    Last skin cancer screening :      Vital Signs: BP 98/60   Pulse 62   Temp 98 F (36.7 C) (Oral)   Ht 5\' 4"  (1.626 m)   Wt 100 lb 12.8 oz (45.7 kg)   SpO2 98%   BMI 17.30 kg/m    Exam: General appearance: alert, cooperative and appears stated age Head: Normocephalic, without obvious abnormality, atraumatic Eyes: conjunctivae/corneas clear. PERRL, EOM's intact. Fundi benign. Ears: normal TM's and external ear canals both ears Nose: Nares normal. Septum midline. Mucosa normal. No drainage or sinus tenderness. Throat: lips, mucosa, and tongue normal; teeth and gums normal Neck: no adenopathy, no carotid bruit, no JVD, supple, symmetrical, trachea midline and thyroid not enlarged, symmetric, no tenderness/mass/nodules Lungs: clear to auscultation bilaterally Breasts: normal appearance, no masses or tenderness Heart: regular rate and rhythm, S1, S2 normal, no murmur, click, rub or gallop Abdomen: soft, non-tender; bowel sounds normal; no masses,  no organomegaly Extremities: extremities normal, atraumatic, no cyanosis or edema Pulses: 2+ and symmetric Skin: Skin color, texture, turgor normal. No rashes or lesions Neurologic: Alert and oriented X 3, normal strength and tone. Normal symmetric reflexes. Normal coordination and gait.     End of Life Discussion and Planning   During the course of the visit , End of Life objectives were discussed at length.  Patient does not have a living will in place or a healthcare power of attorney.  Patient  was given printed information about advance directives and encouraged to return after discussing with their family.  Review of Opioid Prescriptions    Patient does not have a current opioid  prescription Patient's risk factors for opioid use disorder was reviewed and includes nothing  Patient risk for potential substance abuse was assessed

## 2023-07-26 NOTE — Patient Instructions (Addendum)
 [employer] ! Makes good tasting frittata that are frozen and microwaveable   You might want to try using Relaxium for insomnia  (as seen on TV commercials) . It is available through [employer] and contains all natural supplements:  Melatonin 5 mg  Chamomile 25 mg Passionflower extract 75 mg GABA 100 mg Ashwaganda extract 125 mg Magnesium citrate, glycinate, oxide (100 mg)  L tryptophan 500 mg Valerest (proprietary  ingredient ; probably valeria root extract)    Raloxifene refilled.  We'll repeat DEXA in 2026

## 2023-07-27 LAB — COMPREHENSIVE METABOLIC PANEL WITH GFR
ALT: 21 IU/L (ref 0–32)
AST: 27 IU/L (ref 0–40)
Albumin: 4.5 g/dL (ref 3.9–4.9)
Alkaline Phosphatase: 84 IU/L (ref 44–121)
BUN/Creatinine Ratio: 22 (ref 12–28)
BUN: 15 mg/dL (ref 8–27)
Bilirubin Total: 0.4 mg/dL (ref 0.0–1.2)
CO2: 23 mmol/L (ref 20–29)
Calcium: 10 mg/dL (ref 8.7–10.3)
Chloride: 103 mmol/L (ref 96–106)
Creatinine, Ser: 0.69 mg/dL (ref 0.57–1.00)
Globulin, Total: 1.9 g/dL (ref 1.5–4.5)
Glucose: 84 mg/dL (ref 70–99)
Potassium: 4.8 mmol/L (ref 3.5–5.2)
Sodium: 142 mmol/L (ref 134–144)
Total Protein: 6.4 g/dL (ref 6.0–8.5)
eGFR: 96 mL/min/{1.73_m2} (ref >59)

## 2023-07-27 LAB — TSH: TSH: 2.84 u[IU]/mL (ref 0.450–4.500)

## 2023-07-28 ENCOUNTER — Encounter: Payer: Self-pay | Admitting: Internal Medicine

## 2023-08-06 ENCOUNTER — Encounter: Payer: Self-pay | Admitting: Dermatology

## 2023-08-06 ENCOUNTER — Ambulatory Visit: Payer: BC Managed Care – PPO | Admitting: Dermatology

## 2023-08-06 ENCOUNTER — Other Ambulatory Visit: Payer: Self-pay | Admitting: Dermatology

## 2023-08-06 DIAGNOSIS — Z7189 Other specified counseling: Secondary | ICD-10-CM

## 2023-08-06 DIAGNOSIS — I781 Nevus, non-neoplastic: Secondary | ICD-10-CM

## 2023-08-06 DIAGNOSIS — L409 Psoriasis, unspecified: Secondary | ICD-10-CM

## 2023-08-06 DIAGNOSIS — D1801 Hemangioma of skin and subcutaneous tissue: Secondary | ICD-10-CM

## 2023-08-06 DIAGNOSIS — L578 Other skin changes due to chronic exposure to nonionizing radiation: Secondary | ICD-10-CM | POA: Diagnosis not present

## 2023-08-06 DIAGNOSIS — Z79899 Other long term (current) drug therapy: Secondary | ICD-10-CM

## 2023-08-06 DIAGNOSIS — L988 Other specified disorders of the skin and subcutaneous tissue: Secondary | ICD-10-CM

## 2023-08-06 DIAGNOSIS — W908XXA Exposure to other nonionizing radiation, initial encounter: Secondary | ICD-10-CM

## 2023-08-06 DIAGNOSIS — L853 Xerosis cutis: Secondary | ICD-10-CM

## 2023-08-06 DIAGNOSIS — Z1283 Encounter for screening for malignant neoplasm of skin: Secondary | ICD-10-CM

## 2023-08-06 DIAGNOSIS — L918 Other hypertrophic disorders of the skin: Secondary | ICD-10-CM

## 2023-08-06 DIAGNOSIS — L814 Other melanin hyperpigmentation: Secondary | ICD-10-CM | POA: Diagnosis not present

## 2023-08-06 DIAGNOSIS — L719 Rosacea, unspecified: Secondary | ICD-10-CM

## 2023-08-06 DIAGNOSIS — D2272 Melanocytic nevi of left lower limb, including hip: Secondary | ICD-10-CM

## 2023-08-06 DIAGNOSIS — Z808 Family history of malignant neoplasm of other organs or systems: Secondary | ICD-10-CM

## 2023-08-06 DIAGNOSIS — L821 Other seborrheic keratosis: Secondary | ICD-10-CM

## 2023-08-06 DIAGNOSIS — L570 Actinic keratosis: Secondary | ICD-10-CM | POA: Diagnosis not present

## 2023-08-06 DIAGNOSIS — D225 Melanocytic nevi of trunk: Secondary | ICD-10-CM

## 2023-08-06 DIAGNOSIS — L739 Follicular disorder, unspecified: Secondary | ICD-10-CM

## 2023-08-06 DIAGNOSIS — D229 Melanocytic nevi, unspecified: Secondary | ICD-10-CM

## 2023-08-06 DIAGNOSIS — D2271 Melanocytic nevi of right lower limb, including hip: Secondary | ICD-10-CM

## 2023-08-06 MED ORDER — CLINDAMYCIN PHOSPHATE 1 % EX LOTN: TOPICAL_LOTION | CUTANEOUS | 6 refills | Status: AC

## 2023-08-06 MED ORDER — KLISYRI (350 MG) 1 % EX OINT: TOPICAL_OINTMENT | CUTANEOUS | 1 refills | Status: DC

## 2023-08-06 NOTE — Progress Notes (Signed)
 Follow-Up Visit   Subjective  Katrina Fuentes is a 68 y.o. female who presents for the following: Skin Cancer Screening and Full Body Skin Exam. No personal Hx of skin cancer or dysplastic nevi. Hx of AKs. Father had hx of MM and SCC.   The patient presents for Total-Body Skin Exam (TBSE) for skin cancer screening and mole check. The patient has spots, moles and lesions to be evaluated, some may be new or changing and the patient may have concern these could be cancer.    The following portions of the chart were reviewed this encounter and updated as appropriate: medications, allergies, medical history  Review of Systems:  No other skin or systemic complaints except as noted in HPI or Assessment and Plan.  Objective  Well appearing patient in no apparent distress; mood and affect are within normal limits.  A full examination was performed including scalp, head, eyes, ears, nose, lips, neck, chest, axillae, abdomen, back, buttocks, bilateral upper extremities, bilateral lower extremities, hands, feet, fingers, toes, fingernails, and toenails. All findings within normal limits unless otherwise noted below.   Relevant physical exam findings are noted in the Assessment and Plan.  Right Hand Dorsum x4, Left hand dorsum x2, chest x4 (10) Erythematous thin papules/macules with gritty scale.   Assessment & Plan   SKIN CANCER SCREENING PERFORMED TODAY.  FAMILY HISTORY OF SKIN CANCER What type(s): MM, SCC Who affected: Father    ACTINIC DAMAGE WITH PRECANCEROUS ACTINIC KERATOSES Counseling for Topical Chemotherapy Management: Patient exhibits: - Severe, confluent actinic changes with pre-cancerous actinic keratoses that is secondary to cumulative UV radiation exposure over time - Condition that is severe; chronic, not at goal. - diffuse scaly erythematous macules and papules with underlying dyspigmentation - Discussed Prescription "Field Treatment" topical Chemotherapy for Severe,  Chronic Confluent Actinic Changes with Pre-Cancerous Actinic Keratoses Field treatment involves treatment of an entire area of skin that has confluent Actinic Changes (Sun/ Ultraviolet light damage) and PreCancerous Actinic Keratoses by method of PhotoDynamic Therapy (PDT) and/or prescription Topical Chemotherapy agents such as 5-fluorouracil, 5-fluorouracil/calcipotriene, and/or imiquimod.  The purpose is to decrease the number of clinically evident and subclinical PreCancerous lesions to prevent progression to development of skin cancer by chemically destroying early precancer changes that may or may not be visible.  It has been shown to reduce the risk of developing skin cancer in the treated area. As a result of treatment, redness, scaling, crusting, and open sores may occur during treatment course. One or more than one of these methods may be used and may have to be used several times to control, suppress and eliminate the PreCancerous changes. Discussed treatment course, expected reaction, and possible side effects. - Recommend daily broad spectrum sunscreen SPF 30+ to sun-exposed areas, reapply every 2 hours as needed.  - Staying in the shade or wearing long sleeves, sun glasses (UVA+UVB protection) and wide brim hats (4-inch brim around the entire circumference of the hat) are also recommended. - Call for new or changing lesions.  Start Klisyri 5 nights per week to backs of hands and chest. Start once areas treated have healed.   LENTIGINES, SEBORRHEIC KERATOSES, HEMANGIOMAS - Benign normal skin lesions - SK at left medial shoulder  - Benign-appearing - Call for any changes  MELANOCYTIC NEVI - Tan-brown and/or pink-flesh-colored symmetric macules and papules - right anterior ankle 2 mm dark blue macule (Blue Nevus) - right anterior medial ankle 2 mm medium dark brown macule  - right medial foot 4 mm  med/light brown macule   - right upper abdomen 3.5 mm brown macule  - left pretibia 2.5 mm  medium brown macule -Benign-appearing. Stable compared to previous visit. Observation.  Call clinic for new or changing moles.  Recommend daily use of broad spectrum spf 30+ sunscreen to sun-exposed areas.    Xerosis - diffuse xerotic patches - recommend gentle, hydrating skin care - gentle skin care handout given  Varicose Veins/Spider Veins - Dilated blue, purple or red veins at the lower extremities - Reassured - Smaller vessels can be treated by sclerotherapy (a procedure to inject a medicine into the veins to make them disappear) if desired, but the treatment is not covered by insurance. Larger vessels may be covered if symptomatic and we would refer to vascular surgeon if treatment desired.  ROSACEA Exam Mid face erythema with telangiectasias at nose and cheeks.  Chronic condition with duration or expected duration over one year. Currently well-controlled.   Rosacea is a chronic progressive skin condition usually affecting the face of adults, causing redness and/or acne bumps. It is treatable but not curable. It sometimes affects the eyes (ocular rosacea) as well. It may respond to topical and/or systemic medication and can flare with stress, sun exposure, alcohol, exercise, topical steroids (including hydrocortisone/cortisone 10) and some foods.  Daily application of broad spectrum spf 30+ sunscreen to face is recommended to reduce flares.  Patient denies grittiness of the eyes  Treatment Plan Continue Metronidazole cream 0.75% Apply to face QD/BID Call for refills.    FOLLICULITIS Exam: Clear today, pt reports pink crusted follicular papules on the shoulders and arms that come and go.  Clindamycin foam has helped in the past but has run out.  Chronic and persistent condition with duration or expected duration over one year. Condition is improving with treatment but not currently at goal.   Folliculitis occurs due to inflammation of the superficial hair follicle (pore),  resulting in acne-like lesions (pus bumps). It can be infectious (bacterial, fungal) or noninfectious (shaving, tight clothing, heat/sweat, medications).  Folliculitis can be acute or chronic and recommended treatment depends on the underlying cause of folliculitis.    Treatment Plan: Start Clindamycin lotion Apply to AA QD after shower    PSORIASIS Exam: Mild erythema and scale of the occipital scalp 1% BSA.  Chronic and persistent condition with duration or expected duration over one year. Condition is improving with treatment but not currently at goal.    patient denies joint pain  Psoriasis is a chronic non-curable, but treatable genetic/hereditary disease that may have other systemic features affecting other organ systems such as joints (Psoriatic Arthritis). It is associated with an increased risk of inflammatory bowel disease, heart disease, non-alcoholic fatty liver disease, and depression.  Treatments include light and laser treatments; topical medications; and systemic medications including oral and injectables.  Treatment Plan: Continue Clobetasol solution Apply 1-2 gtts QD/BID to scalp prn flares. Avoid face. Call for refills.    Topical steroids (such as triamcinolone, fluocinolone, fluocinonide, mometasone, clobetasol, halobetasol, betamethasone, hydrocortisone) can cause thinning and lightening of the skin if they are used for too long in the same area. Your physician has selected the right strength medicine for your problem and area affected on the body. Please use your medication only as directed by your physician to prevent side effects  FACIAL ELASTOSIS Exam: Rhytides and volume loss face.  Treatment Plan: Continue The Perfect A at bedtime as directed.  Continue Vitamin C every morning.   Recommend daily broad spectrum  sunscreen SPF 30+ to sun-exposed areas, reapply every 2 hours as needed. Call for new or changing lesions.  Staying in the shade or wearing long  sleeves, sun glasses (UVA+UVB protection) and wide brim hats (4-inch brim around the entire circumference of the hat) are also recommended for sun protection.     Acrochordons (Skin Tags) - Fleshy, skin-colored pedunculated papules at axillae - Benign appearing.  - Observe. - If desired, they can be removed with an in office procedure that is not covered by insurance. - Please call the clinic if you notice any new or changing lesions.   AK (ACTINIC KERATOSIS) (10) Right Hand Dorsum x4, Left hand dorsum x2, chest x4 (10) Actinic keratoses are precancerous spots that appear secondary to cumulative UV radiation exposure/sun exposure over time. They are chronic with expected duration over 1 year. A portion of actinic keratoses will progress to squamous cell carcinoma of the skin. It is not possible to reliably predict which spots will progress to skin cancer and so treatment is recommended to prevent development of skin cancer.  Recommend daily broad spectrum sunscreen SPF 30+ to sun-exposed areas, reapply every 2 hours as needed.  Recommend staying in the shade or wearing long sleeves, sun glasses (UVA+UVB protection) and wide brim hats (4-inch brim around the entire circumference of the hat). Call for new or changing lesions. Destruction of lesion - Right Hand Dorsum x4, Left hand dorsum x2, chest x4 (10)  Destruction method: cryotherapy   Informed consent: discussed and consent obtained   Lesion destroyed using liquid nitrogen: Yes   Region frozen until ice ball extended beyond lesion: Yes   Outcome: patient tolerated procedure well with no complications   Post-procedure details: wound care instructions given   Additional details:  Prior to procedure, discussed risks of blister formation, small wound, skin dyspigmentation, or rare scar following cryotherapy. Recommend Vaseline ointment to treated areas while healing.   Return in about 1 year (around 08/05/2024) for TBSE, HxAKs.  I, Jill  Parcell, CMA, am acting as scribe for Artemio Larry, MD.   Documentation: I have reviewed the above documentation for accuracy and completeness, and I agree with the above.  Artemio Larry, MD

## 2023-08-06 NOTE — Patient Instructions (Addendum)
 Cryotherapy Aftercare  Wash gently with soap and water everyday.   Apply Vaseline Jelly daily until healed.   Start Klisyri 5 nights per week to backs of hands and chest. Start once areas treated have healed.   Continue Clobetasol solution Apply once or twice daily to scalp prn flares. Avoid face. Call for refills.    Topical steroids (such as triamcinolone, fluocinolone, fluocinonide, mometasone, clobetasol, halobetasol, betamethasone, hydrocortisone) can cause thinning and lightening of the skin if they are used for too long in the same area. Your physician has selected the right strength medicine for your problem and area affected on the body. Please use your medication only as directed by your physician to prevent side effects   Start Clindamycin lotion Apply to once daily to affected areas of folliculitis after shower   Continue Metronidazole cream 0.75% Apply to face once or twice daily.  Call for refills.     Recommend daily broad spectrum sunscreen SPF 30+ to sun-exposed areas, reapply every 2 hours as needed. Call for new or changing lesions.  Staying in the shade or wearing long sleeves, sun glasses (UVA+UVB protection) and wide brim hats (4-inch brim around the entire circumference of the hat) are also recommended for sun protection.     Melanoma ABCDEs  Melanoma is the most dangerous type of skin cancer, and is the leading cause of death from skin disease.  You are more likely to develop melanoma if you: Have light-colored skin, light-colored eyes, or red or blond hair Spend a lot of time in the sun Tan regularly, either outdoors or in a tanning bed Have had blistering sunburns, especially during childhood Have a close family member who has had a melanoma Have atypical moles or large birthmarks  Early detection of melanoma is key since treatment is typically straightforward and cure rates are extremely high if we catch it early.   The first sign of melanoma is often a  change in a mole or a new dark spot.  The ABCDE system is a way of remembering the signs of melanoma.  A for asymmetry:  The two halves do not match. B for border:  The edges of the growth are irregular. C for color:  A mixture of colors are present instead of an even brown color. D for diameter:  Melanomas are usually (but not always) greater than 6mm - the size of a pencil eraser. E for evolution:  The spot keeps changing in size, shape, and color.  Please check your skin once per month between visits. You can use a small mirror in front and a large mirror behind you to keep an eye on the back side or your body.   If you see any new or changing lesions before your next follow-up, please call to schedule a visit.  Please continue daily skin protection including broad spectrum sunscreen SPF 30+ to sun-exposed areas, reapplying every 2 hours as needed when you're outdoors.   Staying in the shade or wearing long sleeves, sun glasses (UVA+UVB protection) and wide brim hats (4-inch brim around the entire circumference of the hat) are also recommended for sun protection.      Due to recent changes in healthcare laws, you may see results of your pathology and/or laboratory studies on MyChart before the doctors have had a chance to review them. We understand that in some cases there may be results that are confusing or concerning to you. Please understand that not all results are received at the same  time and often the doctors may need to interpret multiple results in order to provide you with the best plan of care or course of treatment. Therefore, we ask that you please give [place] 2 business days to thoroughly review all your results before contacting the office for clarification. Should we see a critical lab result, you will be contacted sooner.   If You Need Anything After Your Visit  If you have any questions or concerns for your doctor, please call our main line at [phone] and press option 4  to reach your doctor's medical assistant. If no one answers, please leave a voicemail as directed and we will return your call as soon as possible. Messages left after 4 pm will be answered the following business day.   You may also send [place] a message via MyChart. We typically respond to MyChart messages within 1-2 business days.  For prescription refills, please ask your pharmacy to contact our office. Our fax number is [phone].  If you have an urgent issue when the clinic is closed that cannot wait until the next business day, you can page your doctor at the number below.    Please note that while we do our best to be available for urgent issues outside of office hours, we are not available 24/7.   If you have an urgent issue and are unable to reach [place], you may choose to seek medical care at your doctor's office, retail clinic, urgent care center, or emergency room.  If you have a medical emergency, please immediately call 911 or go to the emergency department.  Pager Numbers  - Dr. Gwen Pounds: [phone]  - Dr. Roseanne Reno: [phone]  - Dr. Katrinka Blazing: [phone]   In the event of inclement weather, please call our main line at [phone] for an update on the status of any delays or closures.  Dermatology Medication Tips: Please keep the boxes that topical medications come in in order to help keep track of the instructions about where and how to use these. Pharmacies typically print the medication instructions only on the boxes and not directly on the medication tubes.   If your medication is too expensive, please contact our office at [phone] option 4 or send [place] a message through MyChart.   We are unable to tell what your co-pay for medications will be in advance as this is different depending on your insurance coverage. However, we may be able to find a substitute medication at lower cost or fill out paperwork to get insurance to cover a needed medication.   If a prior  authorization is required to get your medication covered by your insurance company, please allow [place] 1-2 business days to complete this process.  Drug prices often vary depending on where the prescription is filled and some pharmacies may offer cheaper prices.  The website www.goodrx.com contains coupons for medications through different pharmacies. The prices here do not account for what the cost may be with help from insurance (it may be cheaper with your insurance), but the website can give you the price if you did not use any insurance.  - You can print the associated coupon and take it with your prescription to the pharmacy.  - You may also stop by our office during regular business hours and pick up a GoodRx coupon card.  - If you need your prescription sent electronically to a different pharmacy, notify our office through [facility] or by phone at [phone] option 4.  Si Usted Necesita Algo Despus de Su Visita  Tambin puede enviarnos un mensaje a travs de Clinical cytogeneticist. Por lo general respondemos a los mensajes de MyChart en el transcurso de 1 a 2 das hbiles.  Para renovar recetas, por favor pida a su farmacia que se ponga en contacto con nuestra oficina. Franz Jacks de fax es [place] [phone].  Si tiene un asunto urgente cuando la clnica est cerrada y que no puede esperar hasta el siguiente da hbil, puede llamar/localizar a su doctor(a) al nmero que aparece a continuacin.   Por favor, tenga en cuenta que aunque hacemos todo lo posible para estar disponibles para asuntos urgentes fuera del horario de [place], no estamos disponibles las 24 horas del da, los 7 [address]  [address] de la [place].   Si tiene un problema urgente y no puede comunicarse con nosotros, puede optar por buscar atencin mdica  en el consultorio de su doctor(a), en una clnica privada, en un centro de atencin urgente o en una sala de emergencias.  Si tiene Engineer, drilling, por favor llame  inmediatamente al 911 o vaya a la sala de emergencias.  Nmeros de bper  - Dr. Bary Likes: [phone]  - Dra. Annette Barters: [phone]  - Dr. Felipe Horton: [phone]   En caso de inclemencias del tiempo, por favor llame a Lajuan Pila principal al [phone] para una actualizacin sobre el [place] de cualquier retraso o cierre.  Consejos para la medicacin en dermatologa: Por favor, guarde las cajas en las que vienen los medicamentos de uso tpico para ayudarle a seguir las instrucciones sobre dnde y cmo usarlos. Las farmacias generalmente imprimen las instrucciones del medicamento slo en las cajas y no directamente en los tubos del [place].   Si su medicamento es muy caro, por favor, pngase en contacto con Bettyjane Brunet llamando al [phone] y presione la opcin 4 o envenos un mensaje a travs de Clinical cytogeneticist.   No podemos decirle cul ser su copago por los medicamentos por adelantado ya que esto es diferente dependiendo de la cobertura de su seguro. Sin embargo, es posible que podamos encontrar un medicamento sustituto a Audiological scientist un formulario para que el seguro cubra el medicamento que se considera necesario.   Si se requiere una autorizacin previa para que su compaa de seguros [place] su medicamento, por favor permtanos de 1 a 2 das hbiles para completar este proceso.  Los precios de los medicamentos varan con frecuencia dependiendo del Environmental consultant de dnde se surte la receta y alguna farmacias pueden ofrecer precios ms baratos.  El sitio web www.goodrx.com tiene cupones para medicamentos de Health and safety inspector. Los precios aqu no tienen en cuenta lo que podra costar con la ayuda del seguro (puede ser ms barato con su seguro), pero el sitio web puede darle el precio si no utiliz Tourist information centre manager.  - Puede imprimir el cupn correspondiente y llevarlo con su receta a la farmacia.  - Tambin puede pasar por nuestra oficina durante el horario de atencin regular y  Education officer, museum una tarjeta de cupones de GoodRx.  - Si necesita que su receta se enve electrnicamente a una farmacia diferente, informe a nuestra oficina a travs de MyChart de [place] o por telfono llamando al [phone] y presione la opcin 4.

## 2023-08-07 MED ORDER — FLUOROURACIL 5 % EX CREA: TOPICAL_CREAM | CUTANEOUS | 2 refills | Status: AC

## 2023-11-27 ENCOUNTER — Other Ambulatory Visit: Payer: Self-pay | Admitting: Internal Medicine

## 2023-11-27 DIAGNOSIS — Z1231 Encounter for screening mammogram for malignant neoplasm of breast: Secondary | ICD-10-CM

## 2023-12-18 ENCOUNTER — Encounter: Payer: Self-pay | Admitting: Internal Medicine

## 2023-12-18 MED ORDER — ONDANSETRON 4 MG PO TBDP: 4.0000 mg | ORAL_TABLET | Freq: Three times a day (TID) | ORAL | 0 refills | Status: AC | PRN

## 2023-12-18 MED ORDER — AZITHROMYCIN 250 MG PO TABS: ORAL_TABLET | ORAL | 0 refills | Status: AC

## 2024-01-28 ENCOUNTER — Ambulatory Visit
Admission: RE | Admit: 2024-01-28 | Discharge: 2024-01-28 | Disposition: A | Discharge status: Home / Self Care | Source: Ambulatory Visit | Attending: Internal Medicine | Admitting: Internal Medicine

## 2024-01-28 DIAGNOSIS — Z1231 Encounter for screening mammogram for malignant neoplasm of breast: Secondary | ICD-10-CM | POA: Insufficient documentation

## 2024-04-15 ENCOUNTER — Other Ambulatory Visit: Payer: Self-pay | Admitting: Internal Medicine

## 2024-04-15 DIAGNOSIS — M810 Age-related osteoporosis without current pathological fracture: Secondary | ICD-10-CM

## 2024-05-18 ENCOUNTER — Encounter: Payer: Self-pay | Admitting: Internal Medicine

## 2024-05-18 MED ORDER — ACETAZOLAMIDE 125 MG PO TABS: 125.0000 mg | ORAL_TABLET | Freq: Two times a day (BID) | ORAL | 0 refills | Status: AC

## 2024-05-26 ENCOUNTER — Other Ambulatory Visit: Payer: Self-pay | Admitting: Internal Medicine

## 2024-08-25 ENCOUNTER — Encounter: Admitting: Dermatology
